# Patient Record
Sex: Male | Born: 1960 | Race: White | Hispanic: No | Marital: Single | Smoking: Current every day smoker
Health system: Southern US, Community
[De-identification: ages and names within clinical notes are randomized; demographics above are authoritative.]

## PROBLEM LIST (undated history)

## (undated) DIAGNOSIS — I10 Essential (primary) hypertension: Secondary | ICD-10-CM

## (undated) HISTORY — PX: EYE SURGERY: SHX253

---

## 2001-08-10 ENCOUNTER — Ambulatory Visit (HOSPITAL_COMMUNITY): Admission: RE | Admit: 2001-08-10 | Discharge: 2001-08-10 | Payer: Self-pay | Admitting: Family Medicine

## 2001-08-10 ENCOUNTER — Encounter: Payer: Self-pay | Admitting: Family Medicine

## 2001-11-10 ENCOUNTER — Encounter: Payer: Self-pay | Admitting: Family Medicine

## 2001-11-10 ENCOUNTER — Ambulatory Visit (HOSPITAL_COMMUNITY): Admission: RE | Admit: 2001-11-10 | Discharge: 2001-11-10 | Payer: Self-pay | Admitting: Family Medicine

## 2002-11-23 ENCOUNTER — Ambulatory Visit (HOSPITAL_COMMUNITY): Admission: RE | Admit: 2002-11-23 | Discharge: 2002-11-23 | Payer: Self-pay | Admitting: Internal Medicine

## 2008-07-29 ENCOUNTER — Emergency Department (HOSPITAL_COMMUNITY): Admission: EM | Admit: 2008-07-29 | Discharge: 2008-07-29 | Payer: Self-pay | Admitting: Emergency Medicine

## 2008-08-31 ENCOUNTER — Ambulatory Visit (HOSPITAL_COMMUNITY): Admission: RE | Admit: 2008-08-31 | Discharge: 2008-08-31 | Payer: Self-pay | Admitting: Cardiology

## 2010-08-01 LAB — CBC
HCT: 47.2 % (ref 39.0–52.0)
Hemoglobin: 16.4 g/dL (ref 13.0–17.0)
MCHC: 34.6 g/dL (ref 30.0–36.0)
MCV: 88.4 fL (ref 78.0–100.0)
Platelets: 268 10*3/uL (ref 150–400)
RBC: 5.34 MIL/uL (ref 4.22–5.81)
RDW: 13.9 % (ref 11.5–15.5)
WBC: 8.3 10*3/uL (ref 4.0–10.5)

## 2010-08-01 LAB — DIFFERENTIAL
Basophils Absolute: 0.1 10*3/uL (ref 0.0–0.1)
Basophils Relative: 1 % (ref 0–1)
Eosinophils Absolute: 0.1 10*3/uL (ref 0.0–0.7)
Eosinophils Relative: 1 % (ref 0–5)
Lymphocytes Relative: 28 % (ref 12–46)
Lymphs Abs: 2.3 10*3/uL (ref 0.7–4.0)
Monocytes Absolute: 0.6 10*3/uL (ref 0.1–1.0)
Monocytes Relative: 7 % (ref 3–12)
Neutro Abs: 5.2 10*3/uL (ref 1.7–7.7)
Neutrophils Relative %: 62 % (ref 43–77)

## 2010-08-01 LAB — BASIC METABOLIC PANEL
BUN: 13 mg/dL (ref 6–23)
CO2: 26 mEq/L (ref 19–32)
Calcium: 8.9 mg/dL (ref 8.4–10.5)
Chloride: 103 mEq/L (ref 96–112)
Creatinine, Ser: 0.84 mg/dL (ref 0.4–1.5)
GFR calc Af Amer: >60 mL/min (ref >60)
GFR calc non Af Amer: >60 mL/min (ref >60)
Glucose, Bld: 118 mg/dL — ABNORMAL HIGH (ref 70–99)
Potassium: 4 mEq/L (ref 3.5–5.1)
Sodium: 138 mEq/L (ref 135–145)

## 2010-08-01 LAB — POCT CARDIAC MARKERS
CKMB, poc: <1 ng/mL — ABNORMAL LOW (ref 1.0–8.0)
Troponin i, poc: <0.05 ng/mL (ref 0.00–0.09)

## 2010-09-04 NOTE — Cardiovascular Report (Signed)
NAME:  KWASI, JOUNG NO.:  [id number]   MEDICAL RECORD NO.:  [id number]          PATIENT TYPE:  OIB   LOCATION:  2899                         FACILITY:  [facility]   PHYSICIAN:  Nanetta Batty, M.D.   DATE OF BIRTH:  Dec 20, 1960   DATE OF PROCEDURE:  08/31/2008  DATE OF DISCHARGE:  08/31/2008                            CARDIAC CATHETERIZATION   PROCEDURE:  Diagnostic coronary arteriogram.   INDICATIONS:  [name] is a 50 year old gentleman with a history of  hypertension, dyslipidemia, and tobacco abuse who was seen by Dr.  Garen Lah for evaluation of chest pain.  He did have a low risk Myoview,  however, because of his symptoms Dr. Garen Lah felt compelled to proceed  with outpatient diagnostic coronary arteriography to define his anatomy  and rule out an ischemic etiology.   DESCRIPTION OF PROCEDURE:  The patient was brought to the Second Floor  [facility] in the postabsorptive state.  She was  premedicated with p.o. Valium.  Her right groin was prepped and shave in  the usual sterile fashion.  Xylocaine 1% was used for local anesthesia.  A 6-French sheath was inserted into the right femoral artery using  standard Seldinger technique.  A 6-French right-left Judkins diagnostic  catheter as well as a 6-French pigtail catheter were used for selective  coronary angiography, left ventriculography, and distal abdominal  aortography.  Visipaque dye was used for the entirety of the case.  Retrograde aorta, left ventricular, and pullback pressures were  recorded.   HEMODYNAMICS:  1. Aortic systolic pressure 131, diastolic pressure 75.  2. Left ventricular systolic pressure 136, end-diastolic pressure 15.   SELECTIVE CORONARY ANGIOGRAPHY:  1. Left main normal.  2. LAD normal.  3. Left circumflex normal.  4. Right coronary is dominant and normal.  5. Left ventriculography; RAO left ventriculogram was performed using      25 mL of Visipaque dye at 12 mL  per second.  The overall LVEF was      estimated at greater than 60% without focal wall motion      abnormalities.  6. Distal abdominal aortography; distal abdominal aortogram was      performed using 20 mL of Visipaque dye at 20 mL per second.  The      renal arteries revealed at most 30-40% proximal left renal artery      stenosis.  Infrarenal abdominal aorta and iliac bifurcation appear      free of significant atherosclerotic changes.   IMPRESSION:  Mr. Ake has normal coronary arteries, normal left  ventricular function, and minimal renal vascular disease.  We believe  his chest pain is noncardiac.  Empiric antireflux medical therapy will  be recommended as well as cardiac risk factor modification.   Sheath was removed and pressure was held on the groin to achieve  hemostasis.  The patient left the lab in stable condition.  He will  remain recumbent for 5 hours after which time he will be discharged home  and will see Dr. Garen Lah back in followup.      Nanetta Batty, M.D.  Electronically  Signed     JB/MEDQ  D:  08/31/2008  T:  09/01/2008  Job:  [id number]   cc:   [facility]  [facility]  Sheliah Mends, MD  Madelin Rear. Sherwood Gambler, MD

## 2010-09-07 NOTE — Op Note (Signed)
NAME:  Thomas Johns, Thomas Johns                         ACCOUNT NO.:  [id number]   MEDICAL RECORD NO.:  [id number]                   PATIENT TYPE:  AMB   LOCATION:  DAY                                  FACILITY:  [facility]   PHYSICIAN:  R. Roetta Sessions, M.D.              DATE OF BIRTH:  1961/03/20   DATE OF PROCEDURE:  11/23/2002  DATE OF DISCHARGE:                                 OPERATIVE REPORT   PROCEDURE:  Diagnostic esophagogastroduodenoscopy and colonoscopy with snare  polypectomy and biopsy.   ENDOSCOPIST:  Gerrit Friends. Rourk, M.D.   INDICATIONS FOR PROCEDURE:  The patient is a 50 year old gentleman with  longstanding gastroesophageal reflux disease with intermittent blood per  rectum recently.  He is Hemoccult-positive.  His reflux symptoms have been  somewhat refractory to Nexium.  He has had vague intermittent esophageal  dysphagia.  EGD and colonoscopy are now being done.  CBC in my office,  recently, was completely normal.   Please see my dictated note of November 11, 2002 for more information.   PROCEDURE NOTE:  O2 saturation, blood pressure, pulse and respirations were  monitored throughout the entire procedure.  Conscious sedation: Versed 4 mg  IV, Demerol 100 mg IV in divided doses.   INSTRUMENT:  Olympus video chip adult gastroscope and colonoscope.   FINDINGS:  Examination of the tubular esophagus revealed a couple of small  distal esophageal erosions with no Barrett's esophagus.  The EG junction was  patulous.  He retched a couple of times during the procedure and there was  no evidence of hernia with those maneuvers.  The EG junction was easily  traversed.   STOMACH:  The gastric cavity was empty.  It insufflated well with air.  A  thorough examination of the gastric mucosa including a retroflex view of the  proximal stomach and esophagogastric junction demonstrated only a small  hiatal hernia.  The pylorus was patent and easily traversed.   DUODENUM:  The bulb and the  second portion appeared normal.   THERAPEUTIC/DIAGNOSTIC MANEUVERS:  None.   The patient tolerated the procedure well and was prepared for colonoscopy.   A digital rectal exam revealed no abnormalities.   ENDOSCOPIC FINDINGS:  The prep was adequate.   RECTUM:  Examination of the rectal mucosa including the retroflex view of  the anal verge revealed only minimal internal hemorrhoids.   COLON:  The colonic mucosa was surveyed from the rectosigmoid junction  through the left transverse and right colon to the area of the appendiceal  orifice, ileocecal valve, and cecum.  These structures were well seen and  photographed for the record.   From this level of the scope was slowly withdrawn.  All previously mentioned  mucosal surfaces were again seen.  There were multiple 6-to-7-mm,  pedunculated polyps; 2 just distal to the ileocecal valve; 2 at the hepatic  flexure which were resected with a snare, cautery and  recovered through the  scope.  There may have been 1 other polyp seen transiently behind a fold at  the area of the hepatic flexure; however, this could not be confirmed  because of acute angulation and difficulty with visualization.  Otherwise  there are no colonic mucosal abnormalities.  The patient tolerated both  procedures well and was reacted in endoscopy.   EGD IMPRESSION:  1. Distal esophageal erosions consistent with mild erosive reflux     esophagitis.  Otherwise the esophageal mucosa appeared normal.  2. Normal stomach.  3. Normal D1 and D2.   COLONOSCOPY FINDINGS:  1. Normal rectum.  2. Right colon polyps resected as described above.  3. He did have minimal internal hemorrhoids.   RECOMMENDATIONS:  1. Stop Nexium.  Begin Prevacid 30 mg orally daily.  2. No aspirin or arthritis medications for 10 days.  3. Follow up on path.  4. Further recommendations to follow.   I have discussed my findings and recommendations with the patient and the  patient's mother via  telephone after they arrived home from the hospital.                                               R. Roetta Sessions, M.D.    RMR/MEDQ  D:  11/23/2002  T:  11/23/2002  Job:  [id number]   cc:   Corrie Mckusick, M.D.  [address] [place]ure[place]s[address] [place]  [place]  [place] [address]  Fax: [phone]

## 2010-09-07 NOTE — Consult Note (Signed)
NAME:  Thomas, Johns                     ACCOUNT NO.:  [id number]   MEDICAL RECORD NO.:  [id number]                  PATIENT TYPE:   LOCATION:                                       FACILITY:   PHYSICIAN:  R. Roetta Sessions, M.D.              DATE OF BIRTH:  1961-03-23   DATE OF CONSULTATION:  11/11/2002  DATE OF DISCHARGE:                                   CONSULTATION   PRIMARY CARE PHYSICIAN:  Corrie Mckusick, M.D.   REASON FOR CONSULTATION:  Hemoccult positive stool.   HISTORY OF PRESENT ILLNESS:  Thomas Johns is a pleasant 50 year old  gentleman sent over at the courtesy of Dr. Dorthey Sawyer to further evaluate  hemoccult positive stool.  He returned cards to Dr. Lamar Blinks office and  informed they were positive.  Mr. Cheek thinks that he has been passing a  small amount of blood per rectum on occasion but he is not sure.  He moves  his bowels once daily.  This is the symptom that led him to go see Dr.  Phillips Odor.  Thomas Johns has a 15+ year history of almost daily heartburn  symptoms.  He takes Nexium 40 mg orally daily and continues to have  breakthrough symptoms.  He describes intermittent vague esophageal  dysphagia.  He has never had his upper GI tract evaluated.  He also takes an  OTC pain pill for various aches and pains; he does not know if it is an  NSAID or non-aspirin product.  He smokes two packs of cigarettes per day.  He does not use alcohol.   PAST MEDICAL HISTORY:  Significant for longstanding gastroesophageal reflux  disease.  History of amblyopia and left eye surgery, diminished vision of  left eye; the surgery was performed when he was in the third grade.   FAMILY HISTORY:  Mother has lupus.  Father is alive in good health.  No  history of chronic GI or liver history.   SOCIAL HISTORY:  The patient is single, no children, works second shift at  [employer], smokes two packs of cigarettes per day.   REVIEW OF SYSTEMS:  No chest pain, no dyspnea,  no fever or chills, no change  in weight.   PHYSICAL EXAMINATION:  GENERAL:  Reveals a 50 year old gentleman, slightly  disheveled, alert and conversant.  He has some strabismus.  VITAL SIGNS:  Weight 193, height 5 feet 7.  Temperature 97.3, blood pressure  130/84, pulse 88.  SKIN:  Warm and dry.  There is no jaundice.  HEENT:  No scleral icterus.  Conjunctivae are pink.  Oral cavity:  Dentition  in fair state of repair, no lesions.  JVD is not prominent.  CHEST:  Lungs are clear to auscultation.  CARDIAC:  Regular rate and rhythm without murmur, gallop, or rub.  ABDOMEN:  Nondistended, positive bowel sounds, soft, nontender, without  appreciable mass or organomegaly.  EXTREMITIES:  Have no edema.  RECTAL:  Deferred to the time of colonoscopy.   IMPRESSION:  Mr. Thomas Johns is a pleasant 50 year old gentleman  complaining of recent blood per rectum; he is hemoccult positive.  He has  longstanding reflux symptoms somewhat refractory to Nexium.  He has also  described vague esophageal dysphagia.   RECOMMENDATIONS:  This gentleman needs to have both his upper and lower GI  tract evaluated in the very near future.  I have discussed the approach of  EGD and colonoscopy with Mr. Clinkscale.  Potential risks, benefits, and  alternatives have been reviewed; he is agreeable.  Will plan to set these up  at [facility].   Will also check a CBC today.  Further recommendations to follow.   I would like to thank Dr. Dorthey Sawyer for allowing me to see this nice  gentleman today.                                               Jonathon Bellows, M.D.    RMR/MEDQ  D:  11/11/2002  T:  11/11/2002  Job:  [id number]

## 2010-11-22 ENCOUNTER — Emergency Department (HOSPITAL_COMMUNITY)
Admission: EM | Admit: 2010-11-22 | Discharge: 2010-11-22 | Disposition: A | Discharge status: Home / Self Care | Payer: Worker's Compensation | Attending: Emergency Medicine | Admitting: Emergency Medicine

## 2010-11-22 ENCOUNTER — Encounter: Payer: Self-pay | Admitting: *Deleted

## 2010-11-22 DIAGNOSIS — F17200 Nicotine dependence, unspecified, uncomplicated: Secondary | ICD-10-CM | POA: Insufficient documentation

## 2010-11-22 DIAGNOSIS — W268XXA Contact with other sharp object(s), not elsewhere classified, initial encounter: Secondary | ICD-10-CM | POA: Insufficient documentation

## 2010-11-22 DIAGNOSIS — S61219A Laceration without foreign body of unspecified finger without damage to nail, initial encounter: Secondary | ICD-10-CM

## 2010-11-22 DIAGNOSIS — S61209A Unspecified open wound of unspecified finger without damage to nail, initial encounter: Secondary | ICD-10-CM | POA: Insufficient documentation

## 2010-11-22 DIAGNOSIS — Y9269 Other specified industrial and construction area as the place of occurrence of the external cause: Secondary | ICD-10-CM | POA: Insufficient documentation

## 2010-11-22 DIAGNOSIS — I10 Essential (primary) hypertension: Secondary | ICD-10-CM | POA: Insufficient documentation

## 2010-11-22 HISTORY — DX: Essential (primary) hypertension: I10

## 2010-11-22 MED ORDER — DOUBLE ANTIBIOTIC 500-10000 UNIT/GM EX OINT: TOPICAL_OINTMENT | Freq: Once | CUTANEOUS | Status: DC

## 2010-11-22 MED ORDER — LIDOCAINE HCL (PF) 1 % IJ SOLN
5.0000 mL | Freq: Once | INTRAMUSCULAR | Status: AC
  Administered 2010-11-22: 5 mL
  Filled 2010-11-22: qty 5

## 2010-11-22 MED ORDER — BACITRACIN ZINC 500 UNIT/GM EX OINT
TOPICAL_OINTMENT | CUTANEOUS | Status: AC
  Filled 2010-11-22: qty 0.9

## 2010-11-22 MED ORDER — TETANUS-DIPHTH-ACELL PERTUSSIS 5-2.5-18.5 LF-MCG/0.5 IM SUSP
0.5000 mL | Freq: Once | INTRAMUSCULAR | Status: AC
  Administered 2010-11-22: 0.5 mL via INTRAMUSCULAR
  Filled 2010-11-22: qty 0.5

## 2010-11-22 NOTE — ED Provider Notes (Signed)
History     CSN: [id number] Arrival date & time: 11/22/2010  8:29 PM  Chief Complaint  Patient presents with  . Laceration   HPI Comments: Patient c/o flap type lac to the left third finger.  States that he was trying to remove something from a metal belt that was moving at the time and accidentally caught his finger in it.  Was wearing a cotton glove covered with a latex glove at the time.  Denies numbness, weakness to the finger.  Injury occurred at work.  Patient is right hand dominant  Patient is a 50 y.o. male presenting with skin laceration. The history is provided by the patient.  Laceration  The incident occurred 1 to 2 hours ago. The laceration is located on the left hand. The laceration is 2 cm in size. The laceration mechanism was a a metal edge. The pain is mild. The pain has been constant since onset. He reports no foreign bodies present. His tetanus status is unknown.    Past Medical History  Diagnosis Date  . Hypertension     History reviewed. No pertinent past surgical history.  History reviewed. No pertinent family history.  History  Substance Use Topics  . Smoking status: Current Everyday Smoker  . Smokeless tobacco: Not on file  . Alcohol Use: No      Review of Systems  Constitutional: Negative for fever and chills.  HENT: Negative for neck pain and neck stiffness.   Respiratory: Negative.   Cardiovascular: Negative.   Musculoskeletal: Negative.  Negative for myalgias, back pain, joint swelling and arthralgias.  Skin: Positive for wound.  Neurological: Negative for dizziness, weakness and numbness.  Hematological: Negative for adenopathy. Does not bruise/bleed easily.    Physical Exam  BP 148/80  Pulse 105  Temp 98.8 F (37.1 C)  Resp 20  Ht 5\' 6"  (1.676 m)  Wt 175 lb (79.379 kg)  BMI 28.25 kg/m2  SpO2 99%  Physical Exam  Nursing note and vitals reviewed. Constitutional: He is oriented to person, place, and time. He appears well-developed and  well-nourished. No distress.  HENT:  Head: Normocephalic and atraumatic.  Neck: Normal range of motion.  Cardiovascular: Normal rate, regular rhythm and normal heart sounds.   Pulmonary/Chest: Effort normal and breath sounds normal.  Musculoskeletal: He exhibits no edema and no tenderness.  Lymphadenopathy:    He has no cervical adenopathy.  Neurological: He is alert and oriented to person, place, and time. He exhibits normal muscle tone. Coordination normal.  Skin: Skin is warm and dry.       2 cm superficial flap type lac to the dorsal left third finger just distal to the DIP joint.  Bleeding controlled.  No joint involvement, muscle or tendon injury seen.  Nail was not involved.  Pt has full ROM of the finger.    ED Course  LACERATION REPAIR Date/Time: 11/22/2010 9:30 PM Performed by: Trisha Mangle, Susen Haskew L. Authorized by: Joya Gaskins Consent: Verbal consent obtained. Written consent not obtained. Risks and benefits: risks, benefits and alternatives were discussed Consent given by: patient Patient understanding: patient states understanding of the procedure being performed Patient consent: the patient's understanding of the procedure matches consent given Procedure consent: procedure consent matches procedure scheduled Relevant documents: relevant documents present and verified Site marked: the operative site was marked Patient identity confirmed: verbally with patient Time out: Immediately prior to procedure a "time out" was called to verify the correct patient, procedure, equipment, support staff and site/side marked as  required. Body area: upper extremity Location details: left long finger Laceration length: 2 cm Foreign bodies: no foreign bodies Tendon involvement: none Nerve involvement: none Vascular damage: no Anesthesia: local infiltration Local anesthetic: lidocaine 1% without epinephrine Anesthetic total: 1 ml Patient sedated: no Preparation: Patient was prepped  and draped in the usual sterile fashion. Irrigation solution: saline Irrigation method: syringe Amount of cleaning: standard Debridement: none Degree of undermining: none Skin closure: 4-0 Prolene Number of sutures: 3 Technique: simple Approximation: close Approximation difficulty: simple Dressing: antibiotic ointment and 4x4 sterile gauze Patient tolerance: Patient tolerated the procedure well with no immediate complications.    MDM    2140  Superficial flap type lac to the dorsal surface of the distal left third finger.  No muscle or tendon injury seen.  Pt has full ROM of the finger.  Distal sensation intact,CR>2 sec.  Dressing was applied by the nursing staff.       Moriah Shawley L. [place], [place] 11/22/10 2202

## 2010-11-22 NOTE — ED Notes (Signed)
Pts left finger was cut by a wire belt at work. Bleeding controlled.

## 2010-11-24 NOTE — ED Provider Notes (Signed)
Medical screening examination/treatment/procedure(s) were performed by non-physician practitioner and as supervising physician I was immediately available for consultation/collaboration.  Meekah Math W Paisely Brick, MD 11/24/10 1811 

## 2015-01-02 ENCOUNTER — Emergency Department (HOSPITAL_COMMUNITY): Payer: BLUE CROSS/BLUE SHIELD

## 2015-01-02 ENCOUNTER — Emergency Department (HOSPITAL_COMMUNITY)
Admission: EM | Admit: 2015-01-02 | Discharge: 2015-01-02 | Disposition: A | Discharge status: Home / Self Care | Payer: BLUE CROSS/BLUE SHIELD | Attending: Emergency Medicine | Admitting: Emergency Medicine

## 2015-01-02 ENCOUNTER — Encounter (HOSPITAL_COMMUNITY): Payer: Self-pay | Admitting: Emergency Medicine

## 2015-01-02 DIAGNOSIS — Z720 Tobacco use: Secondary | ICD-10-CM | POA: Insufficient documentation

## 2015-01-02 DIAGNOSIS — I10 Essential (primary) hypertension: Secondary | ICD-10-CM | POA: Insufficient documentation

## 2015-01-02 DIAGNOSIS — R140 Abdominal distension (gaseous): Secondary | ICD-10-CM | POA: Insufficient documentation

## 2015-01-02 DIAGNOSIS — R600 Localized edema: Secondary | ICD-10-CM | POA: Diagnosis not present

## 2015-01-02 DIAGNOSIS — Z79899 Other long term (current) drug therapy: Secondary | ICD-10-CM | POA: Insufficient documentation

## 2015-01-02 DIAGNOSIS — R0602 Shortness of breath: Secondary | ICD-10-CM | POA: Diagnosis not present

## 2015-01-02 DIAGNOSIS — R05 Cough: Secondary | ICD-10-CM | POA: Diagnosis not present

## 2015-01-02 DIAGNOSIS — R609 Edema, unspecified: Secondary | ICD-10-CM

## 2015-01-02 DIAGNOSIS — M7989 Other specified soft tissue disorders: Secondary | ICD-10-CM | POA: Diagnosis present

## 2015-01-02 LAB — CBC WITH DIFFERENTIAL/PLATELET
Basophils Absolute: 0.1 10*3/uL (ref 0.0–0.1)
Basophils Relative: 1 % (ref 0–1)
EOS ABS: 0.2 10*3/uL (ref 0.0–0.7)
EOS PCT: 3 % (ref 0–5)
HCT: 47.3 % (ref 39.0–52.0)
HEMOGLOBIN: 15.2 g/dL (ref 13.0–17.0)
LYMPHS ABS: 2.3 10*3/uL (ref 0.7–4.0)
LYMPHS PCT: 31 % (ref 12–46)
MCH: 29.2 pg (ref 26.0–34.0)
MCHC: 32.1 g/dL (ref 30.0–36.0)
MCV: 91 fL (ref 78.0–100.0)
MONOS PCT: 12 % (ref 3–12)
Monocytes Absolute: 0.9 10*3/uL (ref 0.1–1.0)
NEUTROS PCT: 53 % (ref 43–77)
Neutro Abs: 3.9 10*3/uL (ref 1.7–7.7)
Platelets: 283 10*3/uL (ref 150–400)
RBC: 5.2 MIL/uL (ref 4.22–5.81)
RDW: 14.9 % (ref 11.5–15.5)
WBC: 7.3 10*3/uL (ref 4.0–10.5)

## 2015-01-02 LAB — COMPREHENSIVE METABOLIC PANEL
ALBUMIN: 4.3 g/dL (ref 3.5–5.0)
ALK PHOS: 86 U/L (ref 38–126)
ALT: 45 U/L (ref 17–63)
AST: 27 U/L (ref 15–41)
Anion gap: 7 (ref 5–15)
BUN: 14 mg/dL (ref 6–20)
CALCIUM: 9 mg/dL (ref 8.9–10.3)
CHLORIDE: 107 mmol/L (ref 101–111)
CO2: 26 mmol/L (ref 22–32)
CREATININE: 0.73 mg/dL (ref 0.61–1.24)
GFR calc Af Amer: >60 mL/min (ref >60)
GFR calc non Af Amer: >60 mL/min (ref >60)
GLUCOSE: 112 mg/dL — AB (ref 65–99)
Potassium: 4 mmol/L (ref 3.5–5.1)
SODIUM: 140 mmol/L (ref 135–145)
Total Bilirubin: 0.4 mg/dL (ref 0.3–1.2)
Total Protein: 7.1 g/dL (ref 6.5–8.1)

## 2015-01-02 LAB — URINALYSIS, ROUTINE W REFLEX MICROSCOPIC
Bilirubin Urine: NEGATIVE
GLUCOSE, UA: NEGATIVE mg/dL
Hgb urine dipstick: NEGATIVE
Ketones, ur: NEGATIVE mg/dL
LEUKOCYTES UA: NEGATIVE
NITRITE: NEGATIVE
PH: 5.5 (ref 5.0–8.0)
Protein, ur: NEGATIVE mg/dL
SPECIFIC GRAVITY, URINE: 1.01 (ref 1.005–1.030)
Urobilinogen, UA: 0.2 mg/dL (ref 0.0–1.0)

## 2015-01-02 LAB — BRAIN NATRIURETIC PEPTIDE: B Natriuretic Peptide: 9 pg/mL (ref 0.0–100.0)

## 2015-01-02 LAB — TROPONIN I

## 2015-01-02 IMAGING — DX DG CHEST 2V
2 series · 2 of 2 positions shown · non-contrast
Comparison: [DATE]

CLINICAL DATA: Shortness of breath. Swelling of the legs and feet.
Nonproductive cough for 3 days. Smoker. Hypertension.

EXAM:
CHEST  2 VIEW

[chest pa]
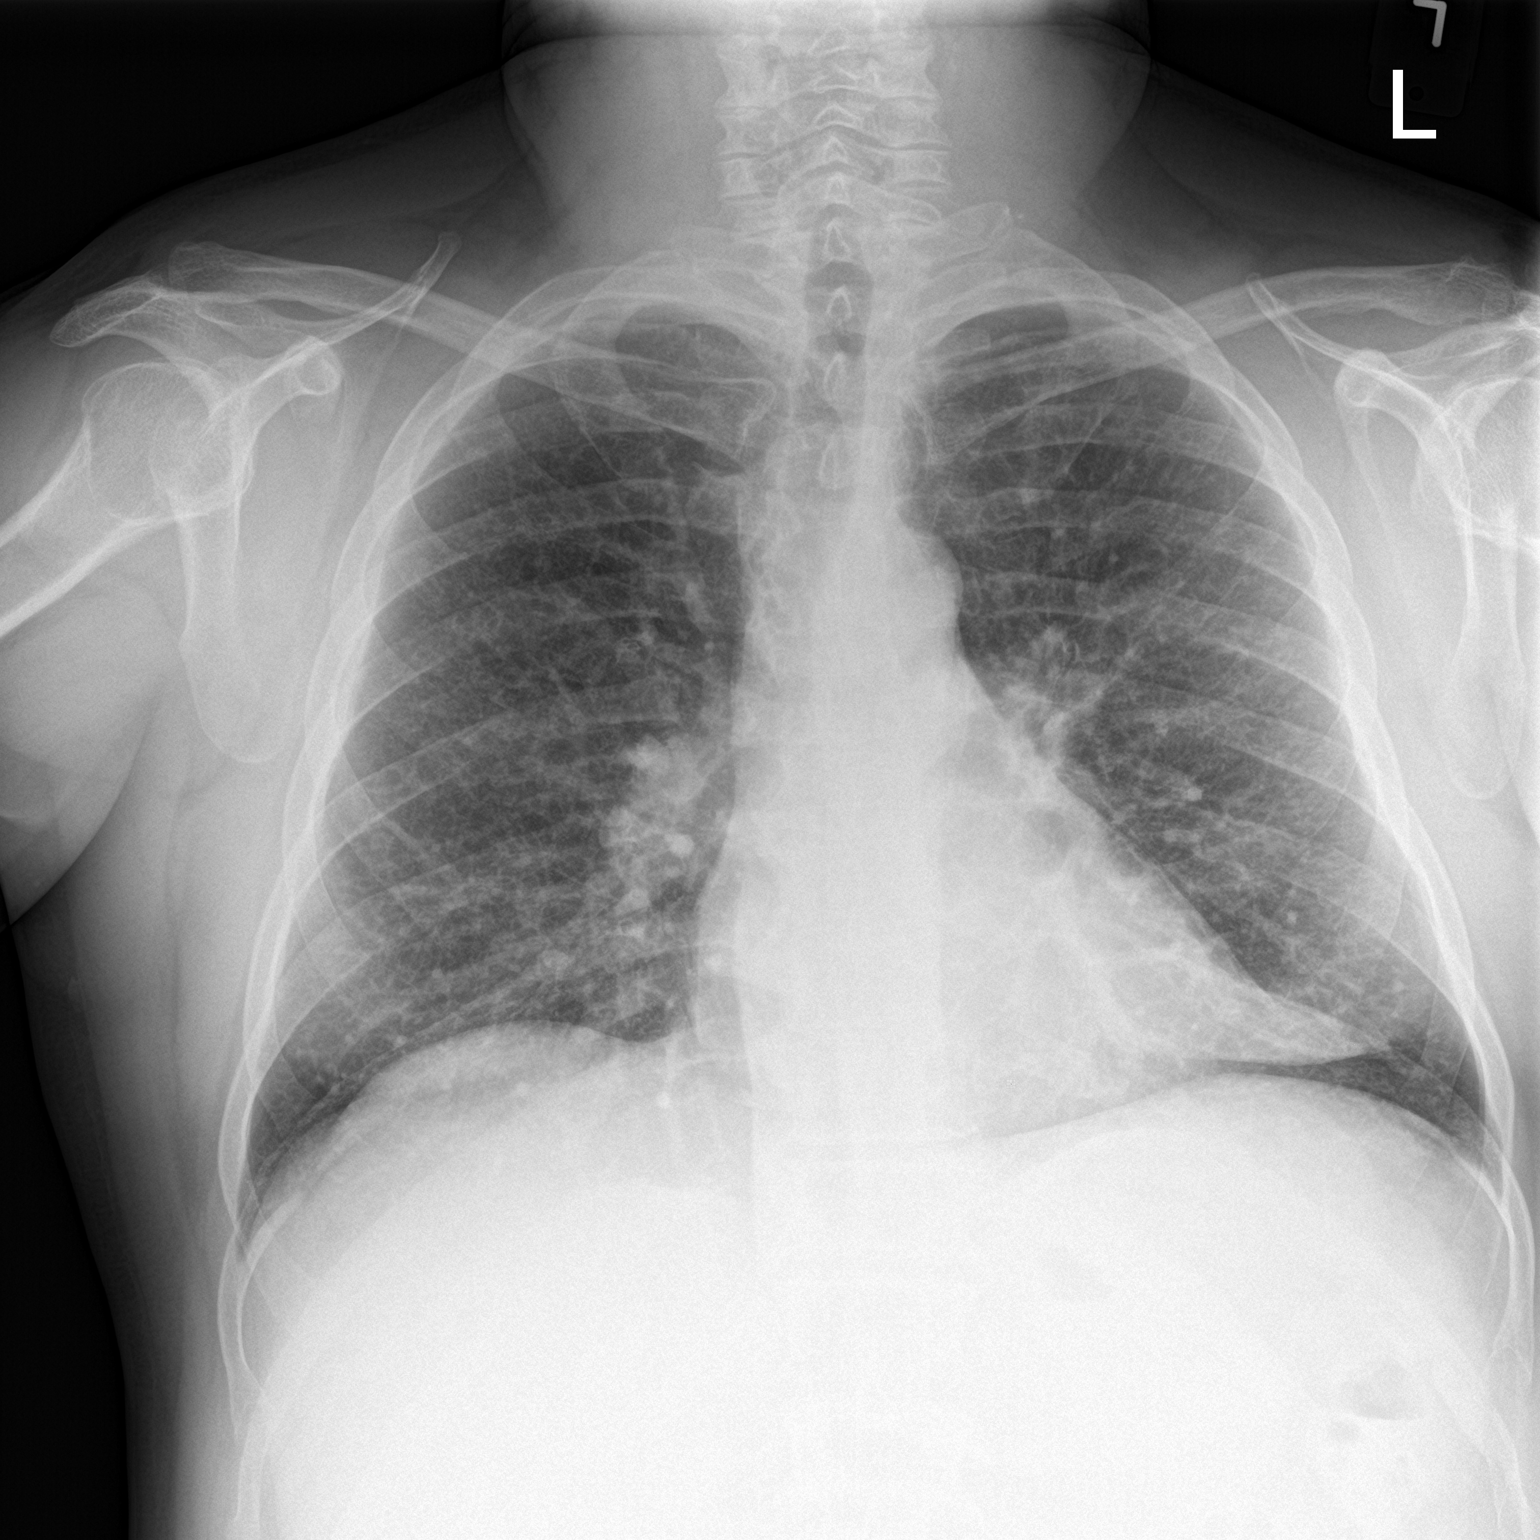

[chest lat]
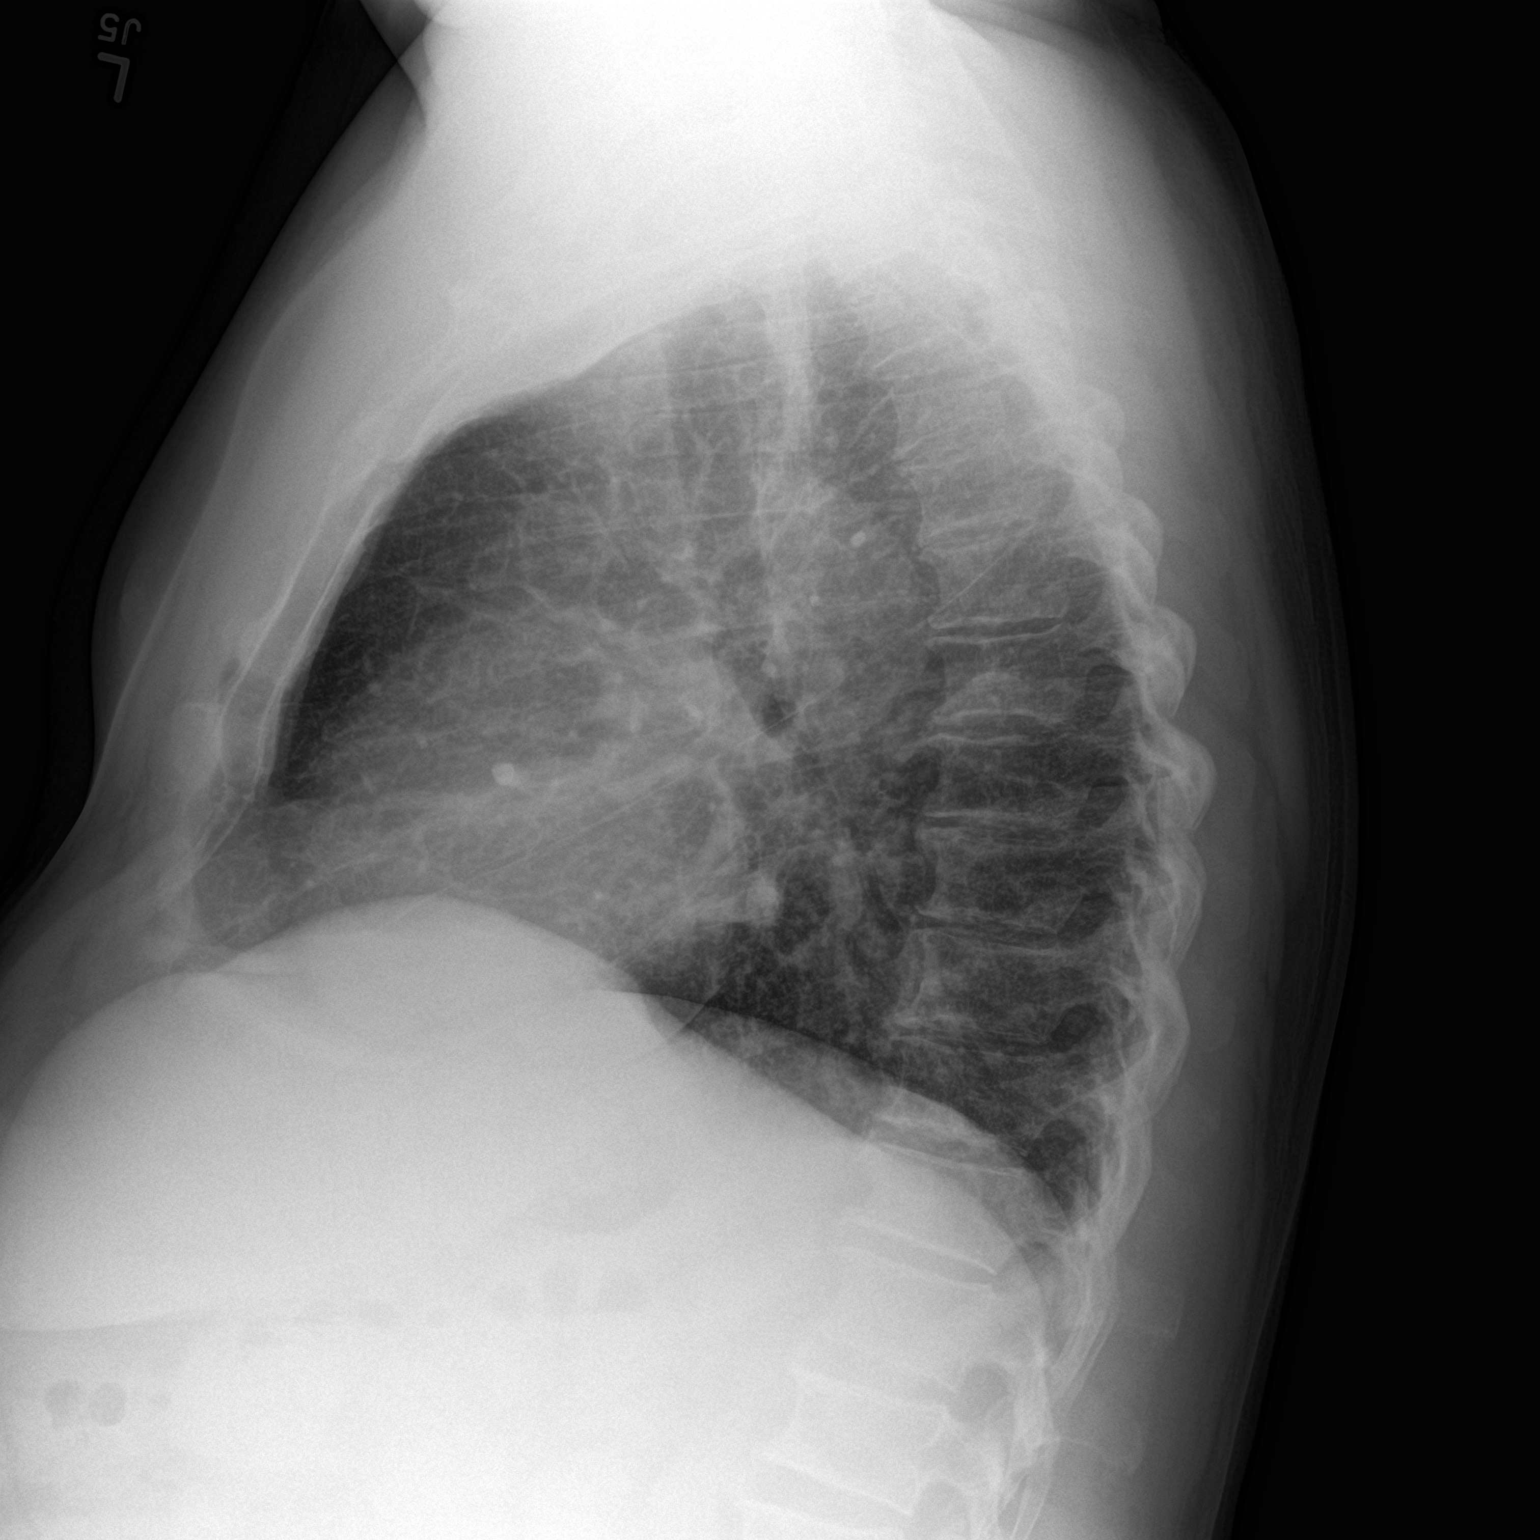

[2 of 2 positions shown; findings below may reference images not displayed]

FINDINGS: Mild interstitial accentuation, as can commonly be encountered in
smokers. The lungs appear otherwise clear.

Cardiac and mediastinal margins appear normal.

No pleural effusion.  Thoracic spondylosis noted.
IMPRESSION: 1. Mild interstitial accentuation, as can commonly be encountered in
smokers.
2. No acute findings.
3. Thoracic spondylosis.

## 2015-01-02 MED ORDER — FUROSEMIDE 10 MG/ML IJ SOLN
60.0000 mg | Freq: Once | INTRAMUSCULAR | Status: AC
  Administered 2015-01-02: 60 mg via INTRAVENOUS
  Filled 2015-01-02: qty 6

## 2015-01-02 MED ORDER — ACETAMINOPHEN 325 MG PO TABS
650.0000 mg | ORAL_TABLET | Freq: Once | ORAL | Status: AC
  Administered 2015-01-02: 650 mg via ORAL
  Filled 2015-01-02: qty 2

## 2015-01-02 MED ORDER — FUROSEMIDE 20 MG PO TABS: 60.0000 mg | ORAL_TABLET | Freq: Every day | ORAL | Status: DC

## 2015-01-02 MED ORDER — NITROGLYCERIN 2 % TD OINT
1.0000 [in_us] | TOPICAL_OINTMENT | Freq: Once | TRANSDERMAL | Status: AC
  Administered 2015-01-02: 1 [in_us] via TOPICAL
  Filled 2015-01-02: qty 1

## 2015-01-02 MED ORDER — ENOXAPARIN SODIUM 100 MG/ML ~~LOC~~ SOLN
1.0000 mg/kg | Freq: Once | SUBCUTANEOUS | Status: AC
  Administered 2015-01-02: 85 mg via SUBCUTANEOUS
  Filled 2015-01-02: qty 1

## 2015-01-02 NOTE — ED Provider Notes (Signed)
CSN: [id number]     Arrival date & time 01/02/15  1423 History   First MD Initiated Contact with Patient 01/02/15 1805    Chief Complaint  Patient presents with  . Leg Swelling     (Consider location/radiation/quality/duration/timing/severity/associated sxs/prior Treatment) HPI  Patient reports about 3 weeks ago he started getting swelling in his legs. He states both legs are swollen but the right is worse than the left. He states the bottom of his feet are sore otherwise he has no pain in. He states his swelling is getting progressively worse and now has swelling in his abdomen and his clothes feel tight. He was seen by his PCP on September 8 and started on Lasix 20 mg once a day. He states it is not making him urinate more frequently and he has noted no improvement in the swelling. He has shortness of breath at times, states he has wheezing at times. However patient states he smokes 2 packs a day. He has a cough that's nonproductive. He denies chest pain. He states he had a episode of swelling several years ago that resolved quickly without treatment.  PCP Dr Gerarda Fraction  Past Medical History  Diagnosis Date  . Hypertension    Past Surgical History  Procedure Laterality Date  . Eye surgery     Family History  Problem Relation Age of Onset  . Lupus Mother   . Rheum arthritis Mother   . Diabetes Mother    Social History  Substance Use Topics  . Smoking status: Current Every Day Smoker -- 2.00 packs/day    Types: Cigarettes  . Smokeless tobacco: Never Used  . Alcohol Use: No  employed  Review of Systems  All other systems reviewed and are negative.     Allergies  Review of patient's allergies indicates no known allergies.  Home Medications   Prior to Admission medications   Medication Sig Start Date End Date Taking? Authorizing Provider  Multiple Vitamin (ONE-A-DAY MENS PO) Take 1 tablet by mouth daily.     Yes Historical Provider, MD  furosemide (LASIX) 20 MG tablet Take  3 tablets (60 mg total) by mouth daily. 01/02/15   Rolland Porter, MD   BP 132/93 mmHg  Pulse 90  Temp(Src) 98 F (36.7 C) (Oral)  Resp 14  Ht 5\' 7"  (1.702 m)  Wt 200 lb (90.719 kg)  BMI 31.32 kg/m2  SpO2 98%  Vital signs normal   Physical Exam  Constitutional: He is oriented to person, place, and time. He appears well-developed and well-nourished.  Non-toxic appearance. He does not appear ill. No distress.  HENT:  Head: Normocephalic and atraumatic.  Right Ear: External ear normal.  Left Ear: External ear normal.  Nose: Nose normal. No mucosal edema or rhinorrhea.  Mouth/Throat: Oropharynx is clear and moist and mucous membranes are normal. No dental abscesses or uvula swelling.  Eyes: Conjunctivae and EOM are normal. Pupils are equal, round, and reactive to light.  Neck: Normal range of motion and full passive range of motion without pain. Neck supple.  Cardiovascular: Normal rate, regular rhythm and normal heart sounds.  Exam reveals no gallop and no friction rub.   No murmur heard. Pulmonary/Chest: Effort normal and breath sounds normal. No respiratory distress. He has no wheezes. He has no rhonchi. He has no rales. He exhibits no tenderness and no crepitus.  Abdominal: Soft. Normal appearance and bowel sounds are normal. He exhibits distension. There is no tenderness. There is no rebound and no guarding.  Musculoskeletal: Normal range of motion. He exhibits edema. He exhibits no tenderness.  Moves all extremities well. Patient has mild diffuse swelling of both lower extremities, right is worse than the left.  Neurological: He is alert and oriented to person, place, and time. He has normal strength. No cranial nerve deficit.  Skin: Skin is warm, dry and intact. No rash noted. No erythema. No pallor.  Psychiatric: He has a normal mood and affect. His speech is normal and behavior is normal. His mood appears not anxious.  Nursing note and vitals reviewed.   ED Course  Procedures  (including critical care time)  Medications  furosemide (LASIX) injection 60 mg (60 mg Intravenous Given 01/02/15 1931)  nitroGLYCERIN (NITROGLYN) 2 % ointment 1 inch (1 inch Topical Given 01/02/15 1932)  acetaminophen (TYLENOL) tablet 650 mg (650 mg Oral Given 01/02/15 1931)  enoxaparin (LOVENOX) injection 85 mg (85 mg Subcutaneous Given 01/02/15 2246)   Pt was given lasix and he had good urinary output. After his tests resulted it was not clear what the etiology of his edema was, his cardiac, hepatic, and renal tests were all normal. Doppler ultrasounds not available at night. Patient refused to get CT of his chest and abdomen tonight. He was given Lovenox 1 mg per KG subcutaneous and scheduled to come back tomorrow afternoon to get Doppler ultrasound of his legs and abdominal ultrasound to check for ascites. Patient was instructed to increase his Lasix from 20 mg a day to 60 mg  once a day.   Labs Review Results for orders placed or performed during the hospital encounter of 01/02/15  Comprehensive metabolic panel  Result Value Ref Range   Sodium 140 135 - 145 mmol/L   Potassium 4.0 3.5 - 5.1 mmol/L   Chloride 107 101 - 111 mmol/L   CO2 26 22 - 32 mmol/L   Glucose, Bld 112 (H) 65 - 99 mg/dL   BUN 14 6 - 20 mg/dL   Creatinine, Ser 0.73 0.61 - 1.24 mg/dL   Calcium 9.0 8.9 - 10.3 mg/dL   Total Protein 7.1 6.5 - 8.1 g/dL   Albumin 4.3 3.5 - 5.0 g/dL   AST 27 15 - 41 U/L   ALT 45 17 - 63 U/L   Alkaline Phosphatase 86 38 - 126 U/L   Total Bilirubin 0.4 0.3 - 1.2 mg/dL   GFR calc non Af Amer >60 >60 mL/min   GFR calc Af Amer >60 >60 mL/min   Anion gap 7 5 - 15  Brain natriuretic peptide  Result Value Ref Range   B Natriuretic Peptide 9.0 0.0 - 100.0 pg/mL  CBC with Differential  Result Value Ref Range   WBC 7.3 4.0 - 10.5 K/uL   RBC 5.20 4.22 - 5.81 MIL/uL   Hemoglobin 15.2 13.0 - 17.0 g/dL   HCT 47.3 39.0 - 52.0 %   MCV 91.0 78.0 - 100.0 fL   MCH 29.2 26.0 - 34.0 pg   MCHC 32.1  30.0 - 36.0 g/dL   RDW 14.9 11.5 - 15.5 %   Platelets 283 150 - 400 K/uL   Neutrophils Relative % 53 43 - 77 %   Neutro Abs 3.9 1.7 - 7.7 K/uL   Lymphocytes Relative 31 12 - 46 %   Lymphs Abs 2.3 0.7 - 4.0 K/uL   Monocytes Relative 12 3 - 12 %   Monocytes Absolute 0.9 0.1 - 1.0 K/uL   Eosinophils Relative 3 0 - 5 %   Eosinophils Absolute 0.2  0.0 - 0.7 K/uL   Basophils Relative 1 0 - 1 %   Basophils Absolute 0.1 0.0 - 0.1 K/uL  Troponin I  Result Value Ref Range   Troponin I <0.03 <0.031 ng/mL  Urinalysis, Routine w reflex microscopic  Result Value Ref Range   Color, Urine YELLOW YELLOW   APPearance CLEAR CLEAR   Specific Gravity, Urine 1.010 1.005 - 1.030   pH 5.5 5.0 - 8.0   Glucose, UA NEGATIVE NEGATIVE mg/dL   Hgb urine dipstick NEGATIVE NEGATIVE   Bilirubin Urine NEGATIVE NEGATIVE   Ketones, ur NEGATIVE NEGATIVE mg/dL   Protein, ur NEGATIVE NEGATIVE mg/dL   Urobilinogen, UA 0.2 0.0 - 1.0 mg/dL   Nitrite NEGATIVE NEGATIVE   Leukocytes, UA NEGATIVE NEGATIVE    Laboratory interpretation all normal except     Imaging Review Dg Chest 2 View  01/02/2015   CLINICAL DATA:  Shortness of breath. Swelling of the legs and feet. Nonproductive cough for 3 days. Smoker. Hypertension.  EXAM: CHEST  2 VIEW  COMPARISON:  07/29/2008  FINDINGS: Mild interstitial accentuation, as can commonly be encountered in smokers. The lungs appear otherwise clear.  Cardiac and mediastinal margins appear normal.  No pleural effusion.  Thoracic spondylosis noted.  IMPRESSION: 1. Mild interstitial accentuation, as can commonly be encountered in smokers. 2. No acute findings. 3. Thoracic spondylosis.   Electronically Signed   By: Van Clines M.D.   On: 01/02/2015 17:46   I have personally reviewed and evaluated these images and lab results as part of my medical decision-making.   EKG Interpretation   Date/Time:  Monday January 02 2015 19:20:37 EDT Ventricular Rate:  89 PR Interval:  136 QRS  Duration: 96 QT Interval:  376 QTC Calculation: 457 R Axis:   94 Text Interpretation:  Sinus rhythm Borderline right axis deviation Since  last tracing rate slower 29 Jul 2008 Confirmed by Marisa Hage  MD-I, Cathline Dowen ([id number])  on 01/02/2015 7:49:58 PM      MDM   Final diagnoses:  Peripheral edema  Abdominal distention    Discharge Medication List as of 01/02/2015 11:10 PM    CONTINUE these medications which have CHANGED   Details  furosemide (LASIX) 20 MG tablet Take 3 tablets (60 mg total) by mouth daily., Starting 01/02/2015, Until Discontinued, Print        Plan discharge  Rolland Porter, MD, Barbette Or, MD 01/03/15 [id number]

## 2015-01-02 NOTE — Discharge Instructions (Signed)
Increase the lasix to 60 mg at one time once a day. Return tomorrow at 1:15 pm to radiology to get outpatient ultrasound of your legs and your abdomen to try to figure out why you are swollen. Do not eat or drink for 6 hrs before your test which would be after 7:30 am. Follow up with Dr Gerarda Fraction later this week.

## 2015-01-02 NOTE — ED Notes (Signed)
Pt was seen for edema of lower ext on 9/9 , started on lasix 20mg s po daily - pt states that this has not helped and feels edema has moved up into his lower abdo - has had some sob, denies hx of CHF

## 2015-01-03 ENCOUNTER — Ambulatory Visit (HOSPITAL_COMMUNITY)
Admit: 2015-01-03 | Discharge: 2015-01-03 | Disposition: A | Discharge status: Home / Self Care | Payer: BLUE CROSS/BLUE SHIELD | Source: Ambulatory Visit | Attending: Emergency Medicine | Admitting: Emergency Medicine

## 2015-01-03 ENCOUNTER — Other Ambulatory Visit (HOSPITAL_COMMUNITY): Payer: Self-pay | Admitting: Emergency Medicine

## 2015-01-03 ENCOUNTER — Ambulatory Visit (HOSPITAL_COMMUNITY)
Admit: 2015-01-03 | Discharge: 2015-01-03 | Disposition: A | Discharge status: Home / Self Care | Payer: BLUE CROSS/BLUE SHIELD | Attending: Emergency Medicine | Admitting: Emergency Medicine

## 2015-01-03 DIAGNOSIS — M79604 Pain in right leg: Secondary | ICD-10-CM | POA: Diagnosis not present

## 2015-01-03 DIAGNOSIS — R140 Abdominal distension (gaseous): Secondary | ICD-10-CM

## 2015-01-03 DIAGNOSIS — M7989 Other specified soft tissue disorders: Secondary | ICD-10-CM | POA: Insufficient documentation

## 2015-01-03 NOTE — ED Provider Notes (Signed)
Patient returned for ER follow-up ultrasound of abdomen and leg as scheduled. Results have been reviewed. Both are negative. Patient reassured, follow-up with primary doctor.  Results for orders placed or performed during the hospital encounter of 01/02/15  Comprehensive metabolic panel  Result Value Ref Range   Sodium 140 135 - 145 mmol/L   Potassium 4.0 3.5 - 5.1 mmol/L   Chloride 107 101 - 111 mmol/L   CO2 26 22 - 32 mmol/L   Glucose, Bld 112 (H) 65 - 99 mg/dL   BUN 14 6 - 20 mg/dL   Creatinine, Ser 0.73 0.61 - 1.24 mg/dL   Calcium 9.0 8.9 - 10.3 mg/dL   Total Protein 7.1 6.5 - 8.1 g/dL   Albumin 4.3 3.5 - 5.0 g/dL   AST 27 15 - 41 U/L   ALT 45 17 - 63 U/L   Alkaline Phosphatase 86 38 - 126 U/L   Total Bilirubin 0.4 0.3 - 1.2 mg/dL   GFR calc non Af Amer >60 >60 mL/min   GFR calc Af Amer >60 >60 mL/min   Anion gap 7 5 - 15  Brain natriuretic peptide  Result Value Ref Range   B Natriuretic Peptide 9.0 0.0 - 100.0 pg/mL  CBC with Differential  Result Value Ref Range   WBC 7.3 4.0 - 10.5 K/uL   RBC 5.20 4.22 - 5.81 MIL/uL   Hemoglobin 15.2 13.0 - 17.0 g/dL   HCT 47.3 39.0 - 52.0 %   MCV 91.0 78.0 - 100.0 fL   MCH 29.2 26.0 - 34.0 pg   MCHC 32.1 30.0 - 36.0 g/dL   RDW 14.9 11.5 - 15.5 %   Platelets 283 150 - 400 K/uL   Neutrophils Relative % 53 43 - 77 %   Neutro Abs 3.9 1.7 - 7.7 K/uL   Lymphocytes Relative 31 12 - 46 %   Lymphs Abs 2.3 0.7 - 4.0 K/uL   Monocytes Relative 12 3 - 12 %   Monocytes Absolute 0.9 0.1 - 1.0 K/uL   Eosinophils Relative 3 0 - 5 %   Eosinophils Absolute 0.2 0.0 - 0.7 K/uL   Basophils Relative 1 0 - 1 %   Basophils Absolute 0.1 0.0 - 0.1 K/uL  Troponin I  Result Value Ref Range   Troponin I <0.03 <0.031 ng/mL  Urinalysis, Routine w reflex microscopic  Result Value Ref Range   Color, Urine YELLOW YELLOW   APPearance CLEAR CLEAR   Specific Gravity, Urine 1.010 1.005 - 1.030   pH 5.5 5.0 - 8.0   Glucose, UA NEGATIVE NEGATIVE mg/dL   Hgb  urine dipstick NEGATIVE NEGATIVE   Bilirubin Urine NEGATIVE NEGATIVE   Ketones, ur NEGATIVE NEGATIVE mg/dL   Protein, ur NEGATIVE NEGATIVE mg/dL   Urobilinogen, UA 0.2 0.0 - 1.0 mg/dL   Nitrite NEGATIVE NEGATIVE   Leukocytes, UA NEGATIVE NEGATIVE   Dg Chest 2 View  01/02/2015   CLINICAL DATA:  Shortness of breath. Swelling of the legs and feet. Nonproductive cough for 3 days. Smoker. Hypertension.  EXAM: CHEST  2 VIEW  COMPARISON:  07/29/2008  FINDINGS: Mild interstitial accentuation, as can commonly be encountered in smokers. The lungs appear otherwise clear.  Cardiac and mediastinal margins appear normal.  No pleural effusion.  Thoracic spondylosis noted.  IMPRESSION: 1. Mild interstitial accentuation, as can commonly be encountered in smokers. 2. No acute findings. 3. Thoracic spondylosis.   Electronically Signed   By: Van Clines M.D.   On: 01/02/2015 17:46  US Abdomen Limited  01/03/2015   CLINICAL DATA:  Abdominal distention for 3 weeks  EXAM: LIMITED ABDOMINAL ULTRASOUND  COMPARISON:  None.  FINDINGS: There is peristalsing bowel throughout the abdomen. No demonstrable ascites. Incidental note is made of diffuse increased echogenicity in the liver consistent with hepatic steatosis.  IMPRESSION: No demonstrable ascites. Evidence of hepatic steatosis. No focal liver lesions are identified. The sensitivity of ultrasound for focal liver lesion detection is diminished in this circumstance.   Electronically Signed   By: Lowella Grip III M.D.   On: 01/03/2015 14:07   US Venous Img Lower Unilateral Right  01/03/2015   CLINICAL DATA:  Right lower extremity pain and swelling for 3 weeks.  EXAM: RIGHT LOWER EXTREMITY VENOUS DOPPLER ULTRASOUND  TECHNIQUE: Gray-scale sonography with graded compression, as well as color Doppler and duplex ultrasound were performed to evaluate the lower extremity deep venous systems from the level of the common femoral vein and including the common femoral,  femoral, profunda femoral, popliteal and calf veins including the posterior tibial, peroneal and gastrocnemius veins when visible. The superficial great saphenous vein was also interrogated. Spectral Doppler was utilized to evaluate flow at rest and with distal augmentation maneuvers in the common femoral, femoral and popliteal veins.  COMPARISON:  None.  FINDINGS: Deep venous system appears patent and compressible from groin through popliteal fossae.  Spontaneous venous flow present with evidence of respiratory phasicity. Augmentation intact.  No intraluminal thrombus identified.  Visualized portions of the greater saphenous veins patent.  The left common femoral vein is patent without DVT.  IMPRESSION: No evidence of deep venous thrombosis.   Electronically Signed   By: Margarette Canada M.D.   On: 01/03/2015 14:28      Orpah Greek, MD 01/03/15 1436

## 2017-01-26 ENCOUNTER — Encounter (HOSPITAL_COMMUNITY): Payer: Self-pay | Admitting: *Deleted

## 2017-01-26 ENCOUNTER — Emergency Department (HOSPITAL_COMMUNITY)
Admission: EM | Admit: 2017-01-26 | Discharge: 2017-01-26 | Disposition: A | Discharge status: Home / Self Care | Payer: BLUE CROSS/BLUE SHIELD | Attending: Emergency Medicine | Admitting: Emergency Medicine

## 2017-01-26 DIAGNOSIS — Z23 Encounter for immunization: Secondary | ICD-10-CM | POA: Insufficient documentation

## 2017-01-26 DIAGNOSIS — Y998 Other external cause status: Secondary | ICD-10-CM | POA: Insufficient documentation

## 2017-01-26 DIAGNOSIS — F1010 Alcohol abuse, uncomplicated: Secondary | ICD-10-CM | POA: Insufficient documentation

## 2017-01-26 DIAGNOSIS — Y929 Unspecified place or not applicable: Secondary | ICD-10-CM | POA: Insufficient documentation

## 2017-01-26 DIAGNOSIS — Y9389 Activity, other specified: Secondary | ICD-10-CM | POA: Insufficient documentation

## 2017-01-26 DIAGNOSIS — Z79899 Other long term (current) drug therapy: Secondary | ICD-10-CM | POA: Insufficient documentation

## 2017-01-26 DIAGNOSIS — S91012A Laceration without foreign body, left ankle, initial encounter: Secondary | ICD-10-CM | POA: Insufficient documentation

## 2017-01-26 DIAGNOSIS — W260XXA Contact with knife, initial encounter: Secondary | ICD-10-CM | POA: Insufficient documentation

## 2017-01-26 DIAGNOSIS — I10 Essential (primary) hypertension: Secondary | ICD-10-CM | POA: Insufficient documentation

## 2017-01-26 DIAGNOSIS — F17210 Nicotine dependence, cigarettes, uncomplicated: Secondary | ICD-10-CM | POA: Insufficient documentation

## 2017-01-26 LAB — CBG MONITORING, ED: GLUCOSE-CAPILLARY: 192 mg/dL — AB (ref 65–99)

## 2017-01-26 MED ORDER — LIDOCAINE HCL (PF) 2 % IJ SOLN
5.0000 mL | Freq: Once | INTRAMUSCULAR | Status: AC
  Administered 2017-01-26: 5 mL via INTRADERMAL

## 2017-01-26 MED ORDER — TETANUS-DIPHTH-ACELL PERTUSSIS 5-2.5-18.5 LF-MCG/0.5 IM SUSP
0.5000 mL | Freq: Once | INTRAMUSCULAR | Status: AC
  Administered 2017-01-26: 0.5 mL via INTRAMUSCULAR
  Filled 2017-01-26: qty 0.5

## 2017-01-26 MED ORDER — LIDOCAINE HCL (PF) 2 % IJ SOLN
INTRAMUSCULAR | Status: AC
  Filled 2017-01-26: qty 10

## 2017-01-26 NOTE — ED Notes (Signed)
Pt reports using a knife a home when it dropped and hit his ankle  3/4 in-1 in cut to ankle with bleeding controlled  Snacks provided  Awaiting repair

## 2017-01-26 NOTE — Discharge Instructions (Signed)
Elevate your leg when possible.  Clean the wound with mild soap and water and keep it bandaged.  Sutures out in 8-10 days.  Return here for any signs of infection

## 2017-01-26 NOTE — ED Provider Notes (Signed)
[facility] DEPT Provider Note   CSN: [id number] Arrival date & time: 01/26/17  2042     History   Chief Complaint Chief Complaint  Patient presents with  . Laceration    HPI JYQUAN KENLEY is a 56 y.o. male.  HPI   DRAE MITZEL is a 56 y.o. male who presents to the Emergency Department complaining of laceration to the left ankle.  States that he was cleaning a knife when it slipped from his hand and landed on his ankle.  Bleeding controlled prior to arrival using pressure.  He denies numbness or difficulty moving the ankle.  Also denies blood thinners.  Admits to alcohol use.  Last Td is unknown   Past Medical History:  Diagnosis Date  . Hypertension     There are no active problems to display for this patient.   Past Surgical History:  Procedure Laterality Date  . EYE SURGERY         Home Medications    Prior to Admission medications   Medication Sig Start Date End Date Taking? Authorizing Provider  furosemide (LASIX) 20 MG tablet Take 3 tablets (60 mg total) by mouth daily. 01/02/15   Rolland Porter, MD  Multiple Vitamin (ONE-A-DAY MENS PO) Take 1 tablet by mouth daily.      [provider]    Family History Family History  Problem Relation Age of Onset  . Lupus Mother   . Rheum arthritis Mother   . Diabetes Mother     Social History Social History  Substance Use Topics  . Smoking status: Current Every Day Smoker    Packs/day: 2.00    Types: Cigarettes  . Smokeless tobacco: Never Used  . Alcohol use Yes     Allergies   Patient has no known allergies.   Review of Systems Review of Systems  Constitutional: Negative for chills and fever.  Musculoskeletal: Negative for arthralgias, back pain and joint swelling.  Skin: Positive for wound.       Laceration ankle  Neurological: Negative for dizziness, weakness and numbness.  Hematological: Does not bruise/bleed easily.  All other systems reviewed and are negative.    Physical  Exam Updated Vital Signs BP (!) 142/74   Pulse 87   Temp 98.5 F (36.9 C)   Resp 16   Ht 5' 6.5" (1.689 m)   Wt 102.1 kg (225 lb)   SpO2 100%   BMI 35.77 kg/m   Physical Exam  Constitutional: He is oriented to person, place, and time. He appears well-developed and well-nourished. No distress.  HENT:  Head: Normocephalic and atraumatic.  Cardiovascular: Normal rate, regular rhythm and intact distal pulses.   No murmur heard. Pulmonary/Chest: Effort normal and breath sounds normal. No respiratory distress.  Musculoskeletal: Normal range of motion. He exhibits tenderness. He exhibits no edema.  Neurological: He is alert and oriented to person, place, and time. He exhibits normal muscle tone. Coordination normal.  Skin: Skin is warm. Capillary refill takes less than 2 seconds. Laceration noted.  Laceration to the medial left ankle.  No edema.  No hematoma  Nursing note and vitals reviewed.    ED Treatments / Results  Labs (all labs ordered are listed, but only abnormal results are displayed) Labs Reviewed  CBG MONITORING, ED - Abnormal; Notable for the following:       Result Value   Glucose-Capillary 192 (*)    All other components within normal limits    EKG  EKG Interpretation None  Radiology No results found.  Procedures Procedures (including critical care time)  LACERATION REPAIR Performed by: [place] Lesure L. Authorized by: Hale Bogus Consent: Verbal consent obtained. Risks and benefits: risks, benefits and alternatives were discussed Consent given by: patient Patient identity confirmed: provided demographic data Prepped and Draped in normal sterile fashion Wound explored  Laceration Location: medial left ankle  Laceration Length: 2 cm  No Foreign Bodies seen or palpated  Anesthesia: local infiltration  Local anesthetic: lidocaine 2% w/o epinephrine  Anesthetic total: 2 ml  Irrigation method: syringe Amount of cleaning:  standard  Skin closure: 4-0 ethilon  Number of sutures: 3  Technique: simple interrupted  Patient tolerance: Patient tolerated the procedure well with no immediate complications.   Medications Ordered in ED Medications  Tdap (BOOSTRIX) injection 0.5 mL (0.5 mLs Intramuscular Given 01/26/17 2210)  lidocaine (XYLOCAINE) 2 % injection 5 mL (5 mLs Intradermal Given 01/26/17 2211)     Initial Impression / Assessment and Plan / ED Course  I have reviewed the triage vital signs and the nursing notes.  Pertinent labs & imaging results that were available during my care of the patient were reviewed by me and considered in my medical decision making (see chart for details).     Td updated. NV intact.  No motor weakness.   ASO applied for support.  Pt d/c to family member at bedside.  Pt agrees to wound care instructions, sutures out in 8-10 days.  Return precautions discussed.  Final Clinical Impressions(s) / ED Diagnoses   Final diagnoses:  Laceration of left ankle, initial encounter    New Prescriptions New Prescriptions   No medications on file     Kem Parkinson, Hershal Coria 01/29/17 2322    Nat Christen, MD 01/31/17 1048

## 2017-01-26 NOTE — ED Triage Notes (Signed)
Pt was cleaning his knife when it slipped and landed on left ankle area causing a laceration, bleeding controlled with pressure bandage, pt smells of etoh use,

## 2017-01-26 NOTE — ED Notes (Signed)
To FT for repair in room rather than hallway- Judson Roch is aware as is TT who has seen

## 2018-11-06 ENCOUNTER — Other Ambulatory Visit: Payer: Self-pay

## 2018-11-06 DIAGNOSIS — Z20822 Contact with and (suspected) exposure to covid-19: Secondary | ICD-10-CM

## 2018-11-10 LAB — NOVEL CORONAVIRUS, NAA: SARS-CoV-2, NAA: NOT DETECTED

## 2020-11-02 ENCOUNTER — Telehealth: Payer: Self-pay

## 2020-11-02 NOTE — Telephone Encounter (Signed)
Followed up with pt by phone with the attempt to get him scheduled with the [facility] to establish care, but unsuccessful at the time of call to reach pt.  Plan -left voice message with contact information of Mayra Reel, RN Case Manager, if unable to reach me once returning call to receive assistance with scheduling his first medical appt with the [facility]

## 2020-11-02 NOTE — Telephone Encounter (Signed)
Client recently enrolled with [facility]. Attempted call to assist client to schedule an appointment to establish care with [facility]. No answer, left voicemail requesting return call.  [place] [employer]

## 2020-11-03 ENCOUNTER — Telehealth: Payer: Self-pay

## 2020-11-03 NOTE — Telephone Encounter (Signed)
Daughter of client called to schedule client's appointment to establish care with [facility]. Appointment made for 11/13/20 at 1:15PM. Plan: Follow up by phone after the first appointment.  [place] [employer]

## 2020-11-13 ENCOUNTER — Ambulatory Visit: Payer: Self-pay | Admitting: Physician Assistant

## 2020-11-13 ENCOUNTER — Encounter: Payer: Self-pay | Admitting: Physician Assistant

## 2020-11-13 VITALS — BP 181/93 | HR 90 | Ht 66.0 in | Wt 240.0 lb

## 2020-11-13 DIAGNOSIS — R609 Edema, unspecified: Secondary | ICD-10-CM

## 2020-11-13 DIAGNOSIS — L304 Erythema intertrigo: Secondary | ICD-10-CM

## 2020-11-13 DIAGNOSIS — L821 Other seborrheic keratosis: Secondary | ICD-10-CM

## 2020-11-13 DIAGNOSIS — R2242 Localized swelling, mass and lump, left lower limb: Secondary | ICD-10-CM

## 2020-11-13 DIAGNOSIS — Z125 Encounter for screening for malignant neoplasm of prostate: Secondary | ICD-10-CM

## 2020-11-13 DIAGNOSIS — Z8639 Personal history of other endocrine, nutritional and metabolic disease: Secondary | ICD-10-CM

## 2020-11-13 DIAGNOSIS — F17200 Nicotine dependence, unspecified, uncomplicated: Secondary | ICD-10-CM

## 2020-11-13 DIAGNOSIS — D492 Neoplasm of unspecified behavior of bone, soft tissue, and skin: Secondary | ICD-10-CM

## 2020-11-13 DIAGNOSIS — Z13220 Encounter for screening for lipoid disorders: Secondary | ICD-10-CM

## 2020-11-13 DIAGNOSIS — Z7689 Persons encountering health services in other specified circumstances: Secondary | ICD-10-CM

## 2020-11-13 DIAGNOSIS — I10 Essential (primary) hypertension: Secondary | ICD-10-CM

## 2020-11-13 DIAGNOSIS — H9190 Unspecified hearing loss, unspecified ear: Secondary | ICD-10-CM

## 2020-11-13 DIAGNOSIS — E669 Obesity, unspecified: Secondary | ICD-10-CM

## 2020-11-13 DIAGNOSIS — J449 Chronic obstructive pulmonary disease, unspecified: Secondary | ICD-10-CM

## 2020-11-13 LAB — GLUCOSE, POCT (MANUAL RESULT ENTRY): POC Glucose: 136 mg/dl — AB (ref 70–99)

## 2020-11-13 MED ORDER — ALBUTEROL SULFATE (2.5 MG/3ML) 0.083% IN NEBU: 2.5000 mg | INHALATION_SOLUTION | Freq: Four times a day (QID) | RESPIRATORY_TRACT | 0 refills | Status: DC | PRN

## 2020-11-13 MED ORDER — LOSARTAN POTASSIUM 50 MG PO TABS: 50.0000 mg | ORAL_TABLET | Freq: Every day | ORAL | 0 refills | Status: DC

## 2020-11-13 MED ORDER — ALBUTEROL SULFATE HFA 108 (90 BASE) MCG/ACT IN AERS: 2.0000 | INHALATION_SPRAY | Freq: Four times a day (QID) | RESPIRATORY_TRACT | 0 refills | Status: DC | PRN

## 2020-11-13 NOTE — Progress Notes (Signed)
BP (!) 181/93   Pulse 90   Ht '5\' 6"'$  (1.676 m)   Wt 240 lb (108.9 kg)   SpO2 95%   BMI 38.74 kg/m    Subjective:    Patient ID: Thomas Johns, male    DOB: Aug 09, 1960, 60 y.o.   MRN: [id number]  HPI: Thomas Johns is a 60 y.o. male presenting on 11/13/2020 for Establish Care   HPI  Pt had a negative covid 19 screening questionnaire.  Chief Complaint  Patient presents with   Establish Care    Pt's niece is with him today.  She helps him with his bills and other things that he needs help to do.    Pt worked Engineer, structural until  5 years ago.  He Went to [employer] until about 4 years ago.  They have many concerns today.  He reports a Growth on inner thigh x 3 years. He says he was told it was a fatty tumor but it bothers him a lot and he thinks Maybe it's getting bigger .  No pain or drainage.   Pt says he was was put on some medication by cardiology that he stopped due to it kept him awake so he stopped it.  Pt says he had LHC at [facility] but records not in epic.    Pt reports history htn and dm and he took meds in the past for both.  He used oral agents for his dm.    Pt says he is not having any chest pains.  He does report sob.  He used nebulizer in the past that helped a lot but he has been out of that.    Other issues: Toenail fungus Swelling [facility]- niece says that evaluation might help disabillity          Relevant past medical, surgical, family and social history reviewed and updated as indicated. Interim medical history since our last visit reviewed. Allergies and medications reviewed and updated.    No current outpatient medications on file.     Review of Systems  Per HPI unless specifically indicated above     Objective:    BP (!) 181/93   Pulse 90   Ht '5\' 6"'$  (1.676 m)   Wt 240 lb (108.9 kg)   SpO2 95%   BMI 38.74 kg/m   Wt Readings from Last 3 Encounters:  11/13/20 240 lb (108.9 kg)  01/26/17 225  lb (102.1 kg)  01/02/15 188 lb 14.4 oz (85.7 kg)    Physical Exam Vitals reviewed.  Constitutional:      General: He is not in acute distress.    Appearance: He is well-developed. He is obese. He is not ill-appearing.  HENT:     Head: Normocephalic and atraumatic.     Right Ear: Tympanic membrane, ear canal and external ear normal. Decreased hearing noted.     Left Ear: Tympanic membrane, ear canal and external ear normal. Decreased hearing noted.  Eyes:     Extraocular Movements: Extraocular movements intact.     Conjunctiva/sclera: Conjunctivae normal.     Pupils: Pupils are equal, round, and reactive to light.  Neck:     Thyroid: No thyromegaly.  Cardiovascular:     Rate and Rhythm: Normal rate and regular rhythm.  Pulmonary:     Effort: Pulmonary effort is normal.     Breath sounds: Normal breath sounds. No wheezing or rales.  Abdominal:     General: Bowel sounds  are normal.     Palpations: Abdomen is soft. There is no mass.     Tenderness: There is no abdominal tenderness.  Genitourinary:    Comments: Large soft mass left groin. Unable to ascertain if this is more a groin or thigh issue.  It is approximately 10cm in length.  It is nontender.  There are no definite borders.  Left groin with very red rash in the skin fold.   Musculoskeletal:     Cervical back: Neck supple.     Right lower leg: Edema present.     Left lower leg: Edema present.  Feet:     Comments: No open sores of the feet Lymphadenopathy:     Cervical: No cervical adenopathy.  Skin:    General: Skin is warm and dry.     Findings: No rash.     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  Very large approximately 4cm pedunculated lesion left tricep area consistent with seborrheic keratosis.  Neurological:     Mental Status: He is alert and oriented to person, place, and time.  Psychiatric:        Attention and Perception: Attention normal.        Mood and  Affect: Mood is not anxious or depressed.        Behavior: Behavior normal. Behavior is cooperative.     Comments: Pt is very pleasant and engages easily.   Denies depression.     Blood sugar 136 (random)              Assessment & Plan:   Encounter Diagnoses  Name Primary?   Encounter to establish care Yes   Primary hypertension    Chronic obstructive pulmonary disease, unspecified COPD type ([place])    Tobacco use disorder    Hearing loss, unspecified hearing loss type, unspecified laterality    History of diabetes mellitus    Screening for prostate cancer    Screening cholesterol level    Obesity, unspecified classification, unspecified obesity type, unspecified whether serious comorbidity present    Abnormal skin growth    Seborrheic keratosis    Mass of left thigh    Intertrigo       -refer To dermatology for abnormal skin lesions  -pt to get Baseline labs  -educated and Encouraged pt to get Covid vaccination  -Rx losartan for htn  & Inhaler & nebs to [facility] the mail order pharmacy.  Losartan and nebs to local pharmacy so he can start them today  -pt will be put on Dental list  -If labs reveal Diabetes,  will refer for dm eye exam  -will get [place] groin/thigh mass  -pt is given [facility] financial assistance application for the [place]  -pt and niece were counseled on care for the intertrigo.   -encouraged Smoking cessation and gave handouts  -Will discuss evaluation for OSA at next appt  -Pt's niece will try to locate cardiology records of [place].   -Pt to follow up 4 wk

## 2020-11-14 ENCOUNTER — Other Ambulatory Visit (HOSPITAL_COMMUNITY): Payer: Self-pay | Admitting: Physician Assistant

## 2020-11-14 ENCOUNTER — Other Ambulatory Visit: Payer: Self-pay

## 2020-11-14 DIAGNOSIS — R2242 Localized swelling, mass and lump, left lower limb: Secondary | ICD-10-CM

## 2020-11-16 ENCOUNTER — Telehealth: Payer: Self-pay

## 2020-11-16 NOTE — Telephone Encounter (Signed)
Attempted call to follow up with client. No answer, left voicemail.  [place] [employer]

## 2020-11-21 ENCOUNTER — Other Ambulatory Visit: Payer: Self-pay

## 2020-11-21 ENCOUNTER — Ambulatory Visit (HOSPITAL_COMMUNITY)
Admission: RE | Admit: 2020-11-21 | Discharge: 2020-11-21 | Disposition: A | Discharge status: Home / Self Care | Payer: Self-pay | Source: Ambulatory Visit | Attending: Physician Assistant | Admitting: Physician Assistant

## 2020-11-21 ENCOUNTER — Other Ambulatory Visit (HOSPITAL_COMMUNITY)
Admission: RE | Admit: 2020-11-21 | Discharge: 2020-11-21 | Disposition: A | Discharge status: Home / Self Care | Payer: Self-pay | Source: Ambulatory Visit | Attending: Physician Assistant | Admitting: Physician Assistant

## 2020-11-21 DIAGNOSIS — Z8639 Personal history of other endocrine, nutritional and metabolic disease: Secondary | ICD-10-CM | POA: Insufficient documentation

## 2020-11-21 DIAGNOSIS — Z13220 Encounter for screening for lipoid disorders: Secondary | ICD-10-CM | POA: Insufficient documentation

## 2020-11-21 DIAGNOSIS — Z125 Encounter for screening for malignant neoplasm of prostate: Secondary | ICD-10-CM | POA: Insufficient documentation

## 2020-11-21 DIAGNOSIS — R2242 Localized swelling, mass and lump, left lower limb: Secondary | ICD-10-CM | POA: Insufficient documentation

## 2020-11-21 DIAGNOSIS — I10 Essential (primary) hypertension: Secondary | ICD-10-CM | POA: Insufficient documentation

## 2020-11-21 LAB — LIPID PANEL
Cholesterol: 148 mg/dL (ref 0–200)
HDL: 21 mg/dL — ABNORMAL LOW (ref >40)
LDL Cholesterol: 93 mg/dL (ref 0–99)
Total CHOL/HDL Ratio: 7 RATIO
Triglycerides: 171 mg/dL — ABNORMAL HIGH (ref <150)
VLDL: 34 mg/dL (ref 0–40)

## 2020-11-21 LAB — COMPREHENSIVE METABOLIC PANEL
ALT: 30 U/L (ref 0–44)
AST: 18 U/L (ref 15–41)
Albumin: 3.7 g/dL (ref 3.5–5.0)
Alkaline Phosphatase: 114 U/L (ref 38–126)
Anion gap: 5 (ref 5–15)
BUN: 8 mg/dL (ref 6–20)
CO2: 29 mmol/L (ref 22–32)
Calcium: 8.3 mg/dL — ABNORMAL LOW (ref 8.9–10.3)
Chloride: 105 mmol/L (ref 98–111)
Creatinine, Ser: 0.86 mg/dL (ref 0.61–1.24)
GFR, Estimated: >60 mL/min (ref >60)
Glucose, Bld: 173 mg/dL — ABNORMAL HIGH (ref 70–99)
Potassium: 3.4 mmol/L — ABNORMAL LOW (ref 3.5–5.1)
Sodium: 139 mmol/L (ref 135–145)
Total Bilirubin: 0.5 mg/dL (ref 0.3–1.2)
Total Protein: 6.5 g/dL (ref 6.5–8.1)

## 2020-11-21 LAB — PSA: Prostatic Specific Antigen: 0.16 ng/mL (ref 0.00–4.00)

## 2020-11-22 LAB — HEMOGLOBIN A1C
Hgb A1c MFr Bld: 8.4 % — ABNORMAL HIGH (ref 4.8–5.6)
Mean Plasma Glucose: 194 mg/dL

## 2020-12-11 ENCOUNTER — Ambulatory Visit: Payer: Self-pay | Admitting: Physician Assistant

## 2020-12-11 VITALS — BP 152/79 | HR 94 | Temp 97.3°F | Wt 239.0 lb

## 2020-12-11 DIAGNOSIS — R2242 Localized swelling, mass and lump, left lower limb: Secondary | ICD-10-CM

## 2020-12-11 DIAGNOSIS — L821 Other seborrheic keratosis: Secondary | ICD-10-CM

## 2020-12-11 DIAGNOSIS — E669 Obesity, unspecified: Secondary | ICD-10-CM

## 2020-12-11 DIAGNOSIS — L304 Erythema intertrigo: Secondary | ICD-10-CM

## 2020-12-11 DIAGNOSIS — H9190 Unspecified hearing loss, unspecified ear: Secondary | ICD-10-CM

## 2020-12-11 DIAGNOSIS — Z86010 Personal history of colonic polyps: Secondary | ICD-10-CM

## 2020-12-11 DIAGNOSIS — I10 Essential (primary) hypertension: Secondary | ICD-10-CM

## 2020-12-11 DIAGNOSIS — E1165 Type 2 diabetes mellitus with hyperglycemia: Secondary | ICD-10-CM

## 2020-12-11 DIAGNOSIS — K089 Disorder of teeth and supporting structures, unspecified: Secondary | ICD-10-CM

## 2020-12-11 DIAGNOSIS — D492 Neoplasm of unspecified behavior of bone, soft tissue, and skin: Secondary | ICD-10-CM

## 2020-12-11 DIAGNOSIS — F17200 Nicotine dependence, unspecified, uncomplicated: Secondary | ICD-10-CM

## 2020-12-11 DIAGNOSIS — R0683 Snoring: Secondary | ICD-10-CM

## 2020-12-11 DIAGNOSIS — J449 Chronic obstructive pulmonary disease, unspecified: Secondary | ICD-10-CM

## 2020-12-11 DIAGNOSIS — R609 Edema, unspecified: Secondary | ICD-10-CM

## 2020-12-11 MED ORDER — ATORVASTATIN CALCIUM 20 MG PO TABS: 20.0000 mg | ORAL_TABLET | Freq: Every day | ORAL | 0 refills | Status: DC

## 2020-12-11 MED ORDER — POTASSIUM CHLORIDE CRYS ER 10 MEQ PO TBCR: EXTENDED_RELEASE_TABLET | ORAL | 0 refills | Status: DC

## 2020-12-11 MED ORDER — METFORMIN HCL 500 MG PO TABS: 500.0000 mg | ORAL_TABLET | Freq: Two times a day (BID) | ORAL | 0 refills | Status: DC

## 2020-12-11 MED ORDER — DULERA 50-5 MCG/ACT IN AERO: 2.0000 | INHALATION_SPRAY | Freq: Two times a day (BID) | RESPIRATORY_TRACT | 0 refills | Status: DC

## 2020-12-11 MED ORDER — FUROSEMIDE 20 MG PO TABS: 20.0000 mg | ORAL_TABLET | Freq: Every day | ORAL | 0 refills | Status: DC | PRN

## 2020-12-11 MED ORDER — MICONAZOLE NITRATE 2 % EX CREA: 1.0000 "application " | TOPICAL_CREAM | Freq: Two times a day (BID) | CUTANEOUS | 1 refills | Status: DC

## 2020-12-11 MED ORDER — LOSARTAN POTASSIUM 100 MG PO TABS: 100.0000 mg | ORAL_TABLET | Freq: Every day | ORAL | 0 refills | Status: DC

## 2020-12-11 NOTE — Patient Instructions (Signed)
  Refer to GI for hx polyps Dermatologty as scheduled. Restart metformin 500 for dm Start atorvastatin for cholesterol Increase losartan to 100 MRI groin mass Check on cafa Refer for DM eye exam He is on dental list Refer for sleep study -  Encouraged covid vaccination' Advair Advair PAP Smoking cut back Lasix 20 and k+

## 2020-12-11 NOTE — Progress Notes (Signed)
BP (!) 152/79   Pulse 94   Temp (!) 97.3 F (36.3 C)   Wt 239 lb (108.4 kg)   SpO2 93%   BMI 38.58 kg/m    Subjective:    Patient ID: Thomas Johns, male    DOB: [birth year], 60 y.o.   MRN: [id number]  HPI:    HPI   Pt had a negative covid 19 screening questionnaire.    Pt is 32yoM who presents for Follow up HTN and COPD.  His niece is with him today.   Pt rescheduled his appointment with dermatology; he is going in September  Pt has records with him of Colonoscopy 2004 which revealed polyps  He also has records for [facility] 2010- normal  Pt says his Intertrigo is improved      Relevant past medical, surgical, family and social history reviewed and updated as indicated. Interim medical history since our last visit reviewed. Allergies and medications reviewed and updated.    Current Outpatient Medications:    albuterol (PROVENTIL) (2.5 MG/3ML) 0.083% nebulizer solution, Take 3 mLs (2.5 mg total) by nebulization every 6 (six) hours as needed for wheezing or shortness of breath., Disp: 75 mL, Rfl: 0   albuterol (VENTOLIN HFA) 108 (90 Base) MCG/ACT inhaler, Inhale 2 puffs into the lungs every 6 (six) hours as needed for wheezing or shortness of breath., Disp: 3 each, Rfl: 0   losartan (COZAAR) 50 MG tablet, Take 1 tablet (50 mg total) by mouth daily., Disp: 90 tablet, Rfl: 0    Review of Systems  Per HPI unless specifically indicated above     Objective:    BP (!) 152/79   Pulse 94   Temp (!) 97.3 F (36.3 C)   Wt 239 lb (108.4 kg)   SpO2 93%   BMI 38.58 kg/m   Wt Readings from Last 3 Encounters:  12/11/20 239 lb (108.4 kg)  11/13/20 240 lb (108.9 kg)  01/26/17 225 lb (102.1 kg)    Physical Exam Vitals reviewed.  Constitutional:      General: He is not in acute distress.    Appearance: He is well-developed. He is obese. He is not ill-appearing.  HENT:     Head: Normocephalic and atraumatic.  Cardiovascular:     Rate and Rhythm: Normal rate and  regular rhythm.  Pulmonary:     Effort: Pulmonary effort is normal.     Breath sounds: Normal breath sounds. No wheezing.  Abdominal:     General: Bowel sounds are normal.     Palpations: Abdomen is soft.     Tenderness: There is no abdominal tenderness.  Musculoskeletal:     Cervical back: Neck supple.     Right lower leg: No edema.     Left lower leg: No edema.  Lymphadenopathy:     Cervical: No cervical adenopathy.  Skin:    General: Skin is warm and dry.  Neurological:     Mental Status: He is alert and oriented to person, place, and time.  Psychiatric:        Behavior: Behavior normal.    Results for orders placed or performed during the hospital encounter of 11/21/20  PSA  Result Value Ref Range   Prostatic Specific Antigen 0.16 0.00 - 4.00 ng/mL  Hemoglobin A1c  Result Value Ref Range   Hgb A1c MFr Bld 8.4 (H) 4.8 - 5.6 %   Mean Plasma Glucose 194 mg/dL  Lipid panel  Result Value Ref Range   Cholesterol 148  0 - 200 mg/dL   Triglycerides 171 (H) <150 mg/dL   HDL 21 (L) >40 mg/dL   Total CHOL/HDL Ratio 7.0 RATIO   VLDL 34 0 - 40 mg/dL   LDL Cholesterol 93 0 - 99 mg/dL  Comprehensive metabolic panel  Result Value Ref Range   Sodium 139 135 - 145 mmol/L   Potassium 3.4 (L) 3.5 - 5.1 mmol/L   Chloride 105 98 - 111 mmol/L   CO2 29 22 - 32 mmol/L   Glucose, Bld 173 (H) 70 - 99 mg/dL   BUN 8 6 - 20 mg/dL   Creatinine, Ser 0.86 0.61 - 1.24 mg/dL   Calcium 8.3 (L) 8.9 - 10.3 mg/dL   Total Protein 6.5 6.5 - 8.1 g/dL   Albumin 3.7 3.5 - 5.0 g/dL   AST 18 15 - 41 U/L   ALT 30 0 - 44 U/L   Alkaline Phosphatase 114 38 - 126 U/L   Total Bilirubin 0.5 0.3 - 1.2 mg/dL   GFR, Estimated >60 >60 mL/min   Anion gap 5 5 - 15      Assessment & Plan:    Encounter Diagnoses  Name Primary?   Primary hypertension Yes   Chronic obstructive pulmonary disease, unspecified COPD type ([place])    Tobacco use disorder    Hearing loss, unspecified hearing loss type, unspecified  laterality    Uncontrolled type 2 diabetes mellitus with hyperglycemia ([facility])    Obesity, unspecified classification, unspecified obesity type, unspecified whether serious comorbidity present    Seborrheic keratosis    Abnormal skin growth    History of colon polyps    Dental disease    Mass of left thigh    Intertrigo    Edema, unspecified type    Snoring        -reViewed labs with pt    Diabetes type 2 -Restart metformin '500mg'$  bid and follow diabetic diet -Start atorvastatin -Refer for DM eye exam  2. HTN -improved.  Will Increase losartan to '100mg'$  daily  3. History colon polyps -Refer to GI for hx polyps  4.Basal cell and seborrheic keratosis -pt to see Dermatology as scheduled.  5. Groin Mass -Order MRI as recommended by [place] to further evaluate the groin mass  6. COPD/smoking -rx Advair.  Pt is given patient assistance application for the medication to complete and return to office.  He was given sample dulera to use until he receives his advair. -pt is encouraged to cut back on smoking or stop altogether  7. Edema- no recent changes -Rx Lasix 20 and k+  8. Intertrigo -rx Miconazole and counseling for intertrigo.  He was given reading information on this  9. Financial -Will Check on [facility] financial assisstance  10. Dental decay He is on dental list  11. At risk OSA -Refer for sleep study -   12. HCM -Encouraged covid vaccination'    Pt to follow up 6 weeks.  He is to contact office sooner prn

## 2020-12-12 ENCOUNTER — Other Ambulatory Visit: Payer: Self-pay | Admitting: Physician Assistant

## 2020-12-12 MED ORDER — ADVAIR HFA 115-21 MCG/ACT IN AERO: 2.0000 | INHALATION_SPRAY | Freq: Two times a day (BID) | RESPIRATORY_TRACT | 3 refills | Status: DC

## 2020-12-19 ENCOUNTER — Encounter: Payer: Self-pay | Admitting: Physician Assistant

## 2020-12-20 ENCOUNTER — Encounter: Payer: Self-pay | Admitting: Internal Medicine

## 2020-12-21 ENCOUNTER — Telehealth: Payer: Self-pay

## 2020-12-21 NOTE — Telephone Encounter (Signed)
Pt notified of MRI scheduled January 01, 2021 at 3:30pm & advised it is requested he arrive at 3:15pm.

## 2021-01-01 ENCOUNTER — Ambulatory Visit (HOSPITAL_COMMUNITY)
Admission: RE | Admit: 2021-01-01 | Discharge: 2021-01-01 | Disposition: A | Discharge status: Home / Self Care | Payer: Self-pay | Source: Ambulatory Visit | Attending: Physician Assistant | Admitting: Physician Assistant

## 2021-01-01 ENCOUNTER — Other Ambulatory Visit: Payer: Self-pay

## 2021-01-01 DIAGNOSIS — R2242 Localized swelling, mass and lump, left lower limb: Secondary | ICD-10-CM | POA: Insufficient documentation

## 2021-01-01 MED ORDER — GADOBUTROL 1 MMOL/ML IV SOLN
10.0000 mL | Freq: Once | INTRAVENOUS | Status: AC | PRN
  Administered 2021-01-01: 10 mL via INTRAVENOUS

## 2021-01-11 ENCOUNTER — Encounter: Payer: Self-pay | Admitting: Internal Medicine

## 2021-01-11 ENCOUNTER — Ambulatory Visit: Payer: Self-pay

## 2021-01-22 ENCOUNTER — Other Ambulatory Visit: Payer: Self-pay

## 2021-01-22 ENCOUNTER — Ambulatory Visit: Payer: Self-pay | Admitting: Physician Assistant

## 2021-01-22 ENCOUNTER — Encounter: Payer: Self-pay | Admitting: Physician Assistant

## 2021-01-22 VITALS — BP 155/92 | HR 81 | Temp 97.6°F | Wt 245.0 lb

## 2021-01-22 DIAGNOSIS — F17200 Nicotine dependence, unspecified, uncomplicated: Secondary | ICD-10-CM

## 2021-01-22 DIAGNOSIS — H9190 Unspecified hearing loss, unspecified ear: Secondary | ICD-10-CM

## 2021-01-22 DIAGNOSIS — I10 Essential (primary) hypertension: Secondary | ICD-10-CM

## 2021-01-22 DIAGNOSIS — E1165 Type 2 diabetes mellitus with hyperglycemia: Secondary | ICD-10-CM

## 2021-01-22 DIAGNOSIS — E669 Obesity, unspecified: Secondary | ICD-10-CM

## 2021-01-22 DIAGNOSIS — J449 Chronic obstructive pulmonary disease, unspecified: Secondary | ICD-10-CM

## 2021-01-22 DIAGNOSIS — D1724 Benign lipomatous neoplasm of skin and subcutaneous tissue of left leg: Secondary | ICD-10-CM

## 2021-01-22 DIAGNOSIS — Z86010 Personal history of colonic polyps: Secondary | ICD-10-CM

## 2021-01-22 NOTE — Progress Notes (Signed)
BP (!) 155/92   Pulse 81   Temp 97.6 F (36.4 C)   Wt 245 lb (111.1 kg)   SpO2 95%   BMI 39.54 kg/m    Subjective:    Patient ID: Thomas Johns, male    DOB: 1961-02-02, 61 y.o.   MRN: [id number]  HPI: GABRIELLE MESTER is a 60 y.o. male presenting on 01/22/2021 for Follow-up   HPI  Pt had a negative covid 19 screening questionnaire.   Pt's niece is with him today.   He went to dermatology and had lesions removed from right temple and left arm.   Pt says his LE Swelling is better,  depends on the day.  His cafa application is incomplete; he has a paper in her car to go today to submit it   Pt says his groin Rash is gone  Pt unsure about his meds; he didn't bring with him.  He has no new complaints today.       Relevant past medical, surgical, family and social history reviewed and updated as indicated. Interim medical history since our last visit reviewed. Allergies and medications reviewed and updated.   Current Outpatient Medications:    albuterol (VENTOLIN HFA) 108 (90 Base) MCG/ACT inhaler, Inhale 2 puffs into the lungs every 6 (six) hours as needed for wheezing or shortness of breath., Disp: 3 each, Rfl: 0   atorvastatin (LIPITOR) 20 MG tablet, Take 1 tablet (20 mg total) by mouth daily., Disp: 90 tablet, Rfl: 0   fluticasone-salmeterol (ADVAIR HFA) 115-21 MCG/ACT inhaler, Inhale 2 puffs into the lungs 2 (two) times daily., Disp: 3 each, Rfl: 3   furosemide (LASIX) 20 MG tablet, Take 1 tablet (20 mg total) by mouth daily as needed for edema., Disp: 90 tablet, Rfl: 0   losartan (COZAAR) 100 MG tablet, Take 1 tablet (100 mg total) by mouth daily., Disp: 90 tablet, Rfl: 0   metFORMIN (GLUCOPHAGE) 500 MG tablet, Take 1 tablet (500 mg total) by mouth 2 (two) times daily with a meal., Disp: 180 tablet, Rfl: 0   miconazole (MICOTIN) 2 % cream, Apply 1 application topically 2 (two) times daily., Disp: 28.35 g, Rfl: 1   Mometasone Furo-Formoterol Fum (DULERA) 50-5  MCG/ACT AERO, Inhale 2 puffs into the lungs 2 (two) times daily., Disp: 1 each, Rfl: 0   potassium chloride (KLOR-CON) 10 MEQ tablet, Take 1/2 tablet po qd when taking furosemide, Disp: 90 tablet, Rfl: 0   albuterol (PROVENTIL) (2.5 MG/3ML) 0.083% nebulizer solution, Take 3 mLs (2.5 mg total) by nebulization every 6 (six) hours as needed for wheezing or shortness of breath. (Patient not taking: Reported on 01/22/2021), Disp: 75 mL, Rfl: 0    Review of Systems  Per HPI unless specifically indicated above     Objective:    BP (!) 155/92   Pulse 81   Temp 97.6 F (36.4 C)   Wt 245 lb (111.1 kg)   SpO2 95%   BMI 39.54 kg/m   Wt Readings from Last 3 Encounters:  01/22/21 245 lb (111.1 kg)  12/11/20 239 lb (108.4 kg)  11/13/20 240 lb (108.9 kg)    Physical Exam Vitals reviewed.  Constitutional:      General: He is not in acute distress.    Appearance: He is well-developed. He is not toxic-appearing.  HENT:     Head: Normocephalic and atraumatic.  Cardiovascular:     Rate and Rhythm: Normal rate and regular rhythm.  Pulmonary:  Effort: Pulmonary effort is normal.     Breath sounds: Normal breath sounds. No wheezing.  Abdominal:     General: Bowel sounds are normal.     Palpations: Abdomen is soft.     Tenderness: There is no abdominal tenderness.  Musculoskeletal:     Cervical back: Neck supple.     Right lower leg: Edema present.     Left lower leg: Edema present.     Comments: Trace BLE edema  Lymphadenopathy:     Cervical: No cervical adenopathy.  Skin:    General: Skin is warm and dry.     Comments: Groin rash resolved  Neurological:     Mental Status: He is alert and oriented to person, place, and time.  Psychiatric:        Behavior: Behavior is cooperative.          Assessment & Plan:    Encounter Diagnoses  Name Primary?   Primary hypertension Yes   Chronic obstructive pulmonary disease, unspecified COPD type ([place])    Tobacco use disorder     Hearing loss, unspecified hearing loss type, unspecified laterality    Uncontrolled type 2 diabetes mellitus with hyperglycemia ([facility])    Obesity, unspecified classification, unspecified obesity type, unspecified whether serious comorbidity present    History of colon polyps    Lipoma of left lower extremity       -Reviewed mri results with pt -encouraged Smoking cessation -discussed Weight management and encouraged healthy diet and regular exercise to reduce weight.  Discussed gradual increase in exercise as tolerated -pt to Get cafa submission completed -pt to call office when he gets home to review meds to see if he is taking what has been prescribed.  His bp is still not controlled but discussed importance of not adding more meds if he isn't taking something that he was prescribed.  Pt is reminded that he should bring his meds with him to every appointment -call came later in the afternoon and pt was still taking the losartan 50mg .  He was counseled to start taking the 100mg  as prescribed and he agreed (niece called and spoke with CMA) -pt still needs sleep study but needs to get his cafa in place -pt has triage appointment with GI  -pt to follow up 6 weeks.  He will need labs prior to that appointment.  He is to contact office sooner prn

## 2021-01-23 DIAGNOSIS — J449 Chronic obstructive pulmonary disease, unspecified: Secondary | ICD-10-CM | POA: Insufficient documentation

## 2021-01-23 DIAGNOSIS — F17200 Nicotine dependence, unspecified, uncomplicated: Secondary | ICD-10-CM | POA: Insufficient documentation

## 2021-01-23 DIAGNOSIS — I10 Essential (primary) hypertension: Secondary | ICD-10-CM | POA: Insufficient documentation

## 2021-01-23 DIAGNOSIS — E1165 Type 2 diabetes mellitus with hyperglycemia: Secondary | ICD-10-CM | POA: Insufficient documentation

## 2021-02-12 ENCOUNTER — Other Ambulatory Visit: Payer: Self-pay | Admitting: Physician Assistant

## 2021-02-14 ENCOUNTER — Telehealth: Payer: Self-pay

## 2021-02-14 NOTE — Telephone Encounter (Signed)
Call placed to dermatology to request records faxed to clinic.

## 2021-02-15 ENCOUNTER — Encounter: Payer: Self-pay | Admitting: Physician Assistant

## 2021-02-20 ENCOUNTER — Other Ambulatory Visit: Payer: Self-pay

## 2021-02-20 ENCOUNTER — Ambulatory Visit (INDEPENDENT_AMBULATORY_CARE_PROVIDER_SITE_OTHER): Payer: Self-pay | Admitting: *Deleted

## 2021-02-20 VITALS — Ht 66.0 in | Wt 243.6 lb

## 2021-02-20 DIAGNOSIS — Z1211 Encounter for screening for malignant neoplasm of colon: Secondary | ICD-10-CM

## 2021-02-20 MED ORDER — PEG 3350-KCL-NA BICARB-NACL 420 G PO SOLR: 4000.0000 mL | Freq: Once | ORAL | 0 refills | Status: AC

## 2021-02-20 NOTE — Progress Notes (Signed)
Gastroenterology Pre-Procedure Review  Request Date: 02/20/2021 Requesting Physician: Soyla Dryer, PA-C @ [facility], Last TCS 11/23/2002 by Dr. Gala Romney, inflammatory polyps, internal hemorrhoids  PATIENT REVIEW QUESTIONS: The patient responded to the following health history questions as indicated:    1. Diabetes Melitis: yes, type II  2. Joint replacements in the past 12 months: no 3. Major health problems in the past 3 months: no 4. Has an artificial valve or MVP: no 5. Has a defibrillator: no 6. Has been advised in past to take antibiotics in advance of a procedure like teeth cleaning: no 7. Family history of colon cancer: no  8. Alcohol Use: no 9. Illicit drug Use: no 10. History of sleep apnea: no  11. History of coronary artery or other vascular stents placed within the last 12 months: no 12. History of any prior anesthesia complications: no 13. Body mass index is 39.32 kg/m.    MEDICATIONS & ALLERGIES:    Patient reports the following regarding taking any blood thinners:   Plavix? no Aspirin? no Coumadin? no Brilinta? no Xarelto? no Eliquis? no Pradaxa? no Savaysa? no Effient? no  Patient confirms/reports the following medications:  Current Outpatient Medications  Medication Sig Dispense Refill   albuterol (PROVENTIL) (2.5 MG/3ML) 0.083% nebulizer solution Take 3 mLs (2.5 mg total) by nebulization every 6 (six) hours as needed for wheezing or shortness of breath. 75 mL 0   albuterol (VENTOLIN HFA) 108 (90 Base) MCG/ACT inhaler Inhale 2 puffs into the lungs every 6 (six) hours as needed for wheezing or shortness of breath. (Patient taking differently: Inhale 2 puffs into the lungs as needed for wheezing or shortness of breath.) 3 each 0   atorvastatin (LIPITOR) 20 MG tablet Take 1 tablet (20 mg total) by mouth daily. 90 tablet 0   fluticasone-salmeterol (ADVAIR HFA) 115-21 MCG/ACT inhaler Inhale 2 puffs into the lungs 2 (two) times daily. 3 each 3   furosemide (LASIX)  20 MG tablet Take 1 tablet (20 mg total) by mouth daily as needed for edema. 90 tablet 0   losartan (COZAAR) 100 MG tablet Take 1 tablet (100 mg total) by mouth daily. 90 tablet 0   metFORMIN (GLUCOPHAGE) 500 MG tablet Take 1 tablet (500 mg total) by mouth 2 (two) times daily with a meal. 180 tablet 0   Mometasone Furo-Formoterol Fum (DULERA) 50-5 MCG/ACT AERO Inhale 2 puffs into the lungs 2 (two) times daily. (Patient taking differently: Inhale 2 puffs into the lungs as needed.) 1 each 0   potassium chloride (KLOR-CON) 10 MEQ tablet Take 1/2 tablet po qd when taking furosemide 90 tablet 0   No current facility-administered medications for this visit.    Patient confirms/reports the following allergies:  No Known Allergies  No orders of the defined types were placed in this encounter.   AUTHORIZATION INFORMATION Primary Insurance: [employer] Pending  SCHEDULE INFORMATION: Procedure has been scheduled as follows:  Date: , Time:   Location:   This Gastroenterology Pre-Precedure Review Form is being routed to the following provider(s): Neil Crouch, PA-C

## 2021-02-20 NOTE — Progress Notes (Signed)
Pt made aware that I will call him once Dec procedure schedules have been released.

## 2021-02-21 ENCOUNTER — Other Ambulatory Visit: Payer: Self-pay | Admitting: Physician Assistant

## 2021-02-21 ENCOUNTER — Encounter: Payer: Self-pay | Admitting: Physician Assistant

## 2021-02-21 DIAGNOSIS — I10 Essential (primary) hypertension: Secondary | ICD-10-CM

## 2021-02-21 DIAGNOSIS — E1165 Type 2 diabetes mellitus with hyperglycemia: Secondary | ICD-10-CM

## 2021-02-21 DIAGNOSIS — E1169 Type 2 diabetes mellitus with other specified complication: Secondary | ICD-10-CM

## 2021-02-21 NOTE — Progress Notes (Signed)
Ok to schedule conscious sedation. ASA II. Day of prep: metformin AM dose only AM of TCS: hold metformin

## 2021-02-23 NOTE — Progress Notes (Signed)
Lmom for pt to call us back. 

## 2021-03-02 NOTE — Progress Notes (Signed)
Lmom for pt to call me back. 

## 2021-03-05 ENCOUNTER — Encounter: Payer: Self-pay | Admitting: Physician Assistant

## 2021-03-05 ENCOUNTER — Ambulatory Visit: Payer: Self-pay | Admitting: Physician Assistant

## 2021-03-05 ENCOUNTER — Encounter: Payer: Self-pay | Admitting: *Deleted

## 2021-03-05 VITALS — BP 136/85 | HR 83 | Temp 97.3°F | Wt 249.0 lb

## 2021-03-05 DIAGNOSIS — Z860100 Personal history of colon polyps, unspecified: Secondary | ICD-10-CM

## 2021-03-05 DIAGNOSIS — E785 Hyperlipidemia, unspecified: Secondary | ICD-10-CM

## 2021-03-05 DIAGNOSIS — F17200 Nicotine dependence, unspecified, uncomplicated: Secondary | ICD-10-CM

## 2021-03-05 DIAGNOSIS — E1169 Type 2 diabetes mellitus with other specified complication: Secondary | ICD-10-CM

## 2021-03-05 DIAGNOSIS — E1165 Type 2 diabetes mellitus with hyperglycemia: Secondary | ICD-10-CM

## 2021-03-05 DIAGNOSIS — J449 Chronic obstructive pulmonary disease, unspecified: Secondary | ICD-10-CM

## 2021-03-05 DIAGNOSIS — I10 Essential (primary) hypertension: Secondary | ICD-10-CM

## 2021-03-05 NOTE — Progress Notes (Signed)
Lmom for pt to call me back.  Mailed letter out to pt.

## 2021-03-05 NOTE — Patient Instructions (Addendum)
[facility]  SLEEP MAIN PHONE: [phone] SLEEP REFERRAL PHONE: [phone] SLEEP REFERRAL FAX: [phone]    Refer to dietician (she will call)   Exercise/increase activity   Get blood drawn/labs   Tito Dine- financial [phone]  [email or web address]

## 2021-03-05 NOTE — Progress Notes (Signed)
BP 136/85   Pulse 83   Temp (!) 97.3 F (36.3 C)   Wt 249 lb (112.9 kg)   SpO2 96%   BMI 40.19 kg/m    Subjective:    Patient ID: Thomas Johns, male    DOB: [birth year], 60 y.o.   MRN: [id number]  HPI: JONAN SEUFERT is a 60 y.o. male presenting on 03/05/2021 for Hypertension, COPD, and Diabetes   HPI   Chief Complaint  Patient presents with   Hypertension   COPD   Diabetes      Pt has talked with GI and a repeat colonscopy is planned (since he has history colon polyps)  Pt hasn't heard/gotten message from [facility].  Notes in epic says they have been unable to contact pt  Pt didn't get labs drawn  Pt didn't bring meds  When asked about his LE swelling, he says he doesn't know  He has an issue with cafa.  His Niece just sent email several days ago.  She had been talking with Diane about it.      Relevant past medical, surgical, family and social history reviewed and updated as indicated. Interim medical history since our last visit reviewed. Allergies and medications reviewed and updated.   Current Outpatient Medications:    albuterol (VENTOLIN HFA) 108 (90 Base) MCG/ACT inhaler, Inhale 2 puffs into the lungs every 6 (six) hours as needed for wheezing or shortness of breath., Disp: 3 each, Rfl: 0   atorvastatin (LIPITOR) 20 MG tablet, Take 1 tablet (20 mg total) by mouth daily., Disp: 90 tablet, Rfl: 0   fluticasone-salmeterol (ADVAIR HFA) 115-21 MCG/ACT inhaler, Inhale 2 puffs into the lungs 2 (two) times daily., Disp: 3 each, Rfl: 3   losartan (COZAAR) 100 MG tablet, Take 1 tablet (100 mg total) by mouth daily., Disp: 90 tablet, Rfl: 0   metFORMIN (GLUCOPHAGE) 500 MG tablet, Take 1 tablet (500 mg total) by mouth 2 (two) times daily with a meal., Disp: 180 tablet, Rfl: 0   potassium chloride (KLOR-CON) 10 MEQ tablet, Take 1/2 tablet po qd when taking furosemide, Disp: 90 tablet, Rfl: 0   albuterol (PROVENTIL) (2.5 MG/3ML) 0.083% nebulizer solution,  Take 3 mLs (2.5 mg total) by nebulization every 6 (six) hours as needed for wheezing or shortness of breath. (Patient not taking: Reported on 03/05/2021), Disp: 75 mL, Rfl: 0   furosemide (LASIX) 20 MG tablet, Take 1 tablet (20 mg total) by mouth daily as needed for edema. (Patient not taking: Reported on 03/05/2021), Disp: 90 tablet, Rfl: 0     Review of Systems  Per HPI unless specifically indicated above     Objective:    BP 136/85   Pulse 83   Temp (!) 97.3 F (36.3 C)   Wt 249 lb (112.9 kg)   SpO2 96%   BMI 40.19 kg/m   Wt Readings from Last 3 Encounters:  03/05/21 249 lb (112.9 kg)  02/20/21 243 lb 9.6 oz (110.5 kg)  01/22/21 245 lb (111.1 kg)    Physical Exam Vitals reviewed.  Constitutional:      General: He is not in acute distress.    Appearance: He is well-developed. He is obese. He is not ill-appearing.  HENT:     Head: Normocephalic and atraumatic.  Cardiovascular:     Rate and Rhythm: Normal rate and regular rhythm.  Pulmonary:     Effort: Pulmonary effort is normal.     Breath sounds: Normal breath sounds. No  wheezing.  Abdominal:     General: Bowel sounds are normal.     Palpations: Abdomen is soft.     Tenderness: There is no abdominal tenderness.  Musculoskeletal:     Cervical back: Neck supple.     Right lower leg: No edema.     Left lower leg: No edema.  Lymphadenopathy:     Cervical: No cervical adenopathy.  Skin:    General: Skin is warm and dry.  Neurological:     Mental Status: He is alert and oriented to person, place, and time.  Psychiatric:        Behavior: Behavior normal.         Assessment & Plan:    Encounter Diagnoses  Name Primary?   Uncontrolled type 2 diabetes mellitus with hyperglycemia ([place]) Yes   Primary hypertension    Chronic obstructive pulmonary disease, unspecified COPD type ([place])    Hyperlipidemia associated with type 2 diabetes mellitus ([place])    Tobacco use disorder    Morbid obesity ([place])    History of  colon polyps      -pt to get fasting labs drawn.  He will be called with  results -Will give Ingrid's contact information so pt can contact about cafa since Diane is out of office for extended. -Will refer to dietician to get help with weight.  Pt is encouraged to exercise regularly to help his weight as well as eat healthy DM diet (due to technical difficulties, referral will be placed after note is closed) -pt to contact [facility] about sleep study since [facility] has been unable to contact pt -pt is reminded that smoking cessation is encouraged -pt to follow up 3 months.  He is to contact office sooner prn

## 2021-03-07 ENCOUNTER — Other Ambulatory Visit: Payer: Self-pay | Admitting: Physician Assistant

## 2021-03-07 DIAGNOSIS — E1165 Type 2 diabetes mellitus with hyperglycemia: Secondary | ICD-10-CM

## 2021-03-08 ENCOUNTER — Other Ambulatory Visit: Payer: Self-pay | Admitting: Physician Assistant

## 2021-03-08 DIAGNOSIS — E1165 Type 2 diabetes mellitus with hyperglycemia: Secondary | ICD-10-CM

## 2021-03-19 ENCOUNTER — Encounter: Payer: Self-pay | Admitting: Physician Assistant

## 2021-03-27 ENCOUNTER — Telehealth: Payer: Self-pay | Admitting: Physician Assistant

## 2021-03-27 NOTE — Telephone Encounter (Signed)
Called pt's niece, [name].  Asked about pt still not having gotten labs drawn.  She says she will take him.  Also asked about referral to [facility] for sleep study.  She says she hadn't called; she was waiting for someone to call her.  I gave her the number and asked her to call to get it scheduled as they haven't been able to contact her (per notes in referral).  She says she will call.

## 2021-04-02 ENCOUNTER — Other Ambulatory Visit (HOSPITAL_COMMUNITY)
Admission: RE | Admit: 2021-04-02 | Discharge: 2021-04-02 | Disposition: A | Discharge status: Home / Self Care | Payer: Self-pay | Source: Ambulatory Visit | Attending: Physician Assistant | Admitting: Physician Assistant

## 2021-04-02 ENCOUNTER — Other Ambulatory Visit: Payer: Self-pay | Admitting: Physician Assistant

## 2021-04-02 DIAGNOSIS — E1165 Type 2 diabetes mellitus with hyperglycemia: Secondary | ICD-10-CM | POA: Insufficient documentation

## 2021-04-02 DIAGNOSIS — I10 Essential (primary) hypertension: Secondary | ICD-10-CM | POA: Insufficient documentation

## 2021-04-02 DIAGNOSIS — E785 Hyperlipidemia, unspecified: Secondary | ICD-10-CM | POA: Insufficient documentation

## 2021-04-02 DIAGNOSIS — E1169 Type 2 diabetes mellitus with other specified complication: Secondary | ICD-10-CM | POA: Insufficient documentation

## 2021-04-02 LAB — LIPID PANEL
Cholesterol: 179 mg/dL (ref 0–200)
HDL: 23 mg/dL — ABNORMAL LOW (ref >40)
LDL Cholesterol: 112 mg/dL — ABNORMAL HIGH (ref 0–99)
Total CHOL/HDL Ratio: 7.8 RATIO
Triglycerides: 219 mg/dL — ABNORMAL HIGH (ref <150)
VLDL: 44 mg/dL — ABNORMAL HIGH (ref 0–40)

## 2021-04-02 LAB — COMPREHENSIVE METABOLIC PANEL
ALT: 45 U/L — ABNORMAL HIGH (ref 0–44)
AST: 33 U/L (ref 15–41)
Albumin: 4 g/dL (ref 3.5–5.0)
Alkaline Phosphatase: 113 U/L (ref 38–126)
Anion gap: 9 (ref 5–15)
BUN: 12 mg/dL (ref 6–20)
CO2: 23 mmol/L (ref 22–32)
Calcium: 8.9 mg/dL (ref 8.9–10.3)
Chloride: 106 mmol/L (ref 98–111)
Creatinine, Ser: 0.85 mg/dL (ref 0.61–1.24)
GFR, Estimated: >60 mL/min (ref >60)
Glucose, Bld: 149 mg/dL — ABNORMAL HIGH (ref 70–99)
Potassium: 4.1 mmol/L (ref 3.5–5.1)
Sodium: 138 mmol/L (ref 135–145)
Total Bilirubin: 0.8 mg/dL (ref 0.3–1.2)
Total Protein: 6.9 g/dL (ref 6.5–8.1)

## 2021-04-03 LAB — MICROALBUMIN, URINE: Microalb, Ur: 15 ug/mL — ABNORMAL HIGH

## 2021-04-04 LAB — HEMOGLOBIN A1C
Hgb A1c MFr Bld: 8.7 % — ABNORMAL HIGH (ref 4.8–5.6)
Mean Plasma Glucose: 203 mg/dL

## 2021-04-09 ENCOUNTER — Other Ambulatory Visit: Payer: Self-pay | Admitting: Physician Assistant

## 2021-04-09 MED ORDER — METFORMIN HCL 1000 MG PO TABS: 1000.0000 mg | ORAL_TABLET | Freq: Two times a day (BID) | ORAL | 1 refills | Status: DC

## 2021-04-09 MED ORDER — ATORVASTATIN CALCIUM 40 MG PO TABS: 40.0000 mg | ORAL_TABLET | Freq: Every day | ORAL | 1 refills | Status: DC

## 2021-04-30 ENCOUNTER — Ambulatory Visit: Payer: Worker's Compensation | Admitting: Nutrition

## 2021-05-22 ENCOUNTER — Ambulatory Visit: Payer: Self-pay | Admitting: Physician Assistant

## 2021-05-23 ENCOUNTER — Other Ambulatory Visit: Payer: Self-pay | Admitting: Physician Assistant

## 2021-06-04 ENCOUNTER — Other Ambulatory Visit: Payer: Self-pay

## 2021-06-04 ENCOUNTER — Ambulatory Visit: Payer: Self-pay | Admitting: Physician Assistant

## 2021-06-04 ENCOUNTER — Telehealth: Payer: Self-pay

## 2021-06-04 ENCOUNTER — Encounter: Payer: Self-pay | Admitting: Physician Assistant

## 2021-06-04 VITALS — BP 132/73 | HR 93 | Temp 98.0°F | Wt 228.0 lb

## 2021-06-04 DIAGNOSIS — Z86010 Personal history of colonic polyps: Secondary | ICD-10-CM

## 2021-06-04 DIAGNOSIS — E1169 Type 2 diabetes mellitus with other specified complication: Secondary | ICD-10-CM

## 2021-06-04 DIAGNOSIS — E1165 Type 2 diabetes mellitus with hyperglycemia: Secondary | ICD-10-CM

## 2021-06-04 DIAGNOSIS — F489 Nonpsychotic mental disorder, unspecified: Secondary | ICD-10-CM

## 2021-06-04 DIAGNOSIS — J449 Chronic obstructive pulmonary disease, unspecified: Secondary | ICD-10-CM

## 2021-06-04 DIAGNOSIS — I10 Essential (primary) hypertension: Secondary | ICD-10-CM

## 2021-06-04 DIAGNOSIS — R0683 Snoring: Secondary | ICD-10-CM

## 2021-06-04 DIAGNOSIS — F17200 Nicotine dependence, unspecified, uncomplicated: Secondary | ICD-10-CM

## 2021-06-04 DIAGNOSIS — E669 Obesity, unspecified: Secondary | ICD-10-CM

## 2021-06-04 MED ORDER — LOSARTAN POTASSIUM 100 MG PO TABS: 100.0000 mg | ORAL_TABLET | Freq: Every day | ORAL | 0 refills | Status: DC

## 2021-06-04 MED ORDER — FLUTICASONE-SALMETEROL 100-50 MCG/ACT IN AEPB: 1.0000 | INHALATION_SPRAY | Freq: Two times a day (BID) | RESPIRATORY_TRACT | 0 refills | Status: DC

## 2021-06-04 NOTE — Patient Instructions (Addendum)
Call dietician to reschedule    -Phone: [phone]  Check on potassium prescription bottle   -GI -sleep study -circulation test (will discuss at next appt)          COVID-19 vaccination significantly lowers your risk of severe illness, hospitalization, and death if you get infected. Compared to people who are up to date with their COVID-19 vaccinations, unvaccinated people aremore likely to get COVID-19, much more likely to be hospitalized with COVID-19, and much more likely to die from COVID-19. Like all vaccines, COVID-19 vaccines are not 100% effective at preventing infection. Some people who are up to date with their COVID-19 vaccinations will get COVID-19 breakthrough infection. However, staying up to date with your COVID-19 vaccinations means that you are less likely to have a breakthrough infection and, if you do get sick, you are less likely to get severely ill or die. Staying up to date with COVID-19 vaccination also means you are less likely to spread the disease to others and increases your protection against new variants of SARS-CoV-2, the virus that causes COVID-19.

## 2021-06-04 NOTE — Telephone Encounter (Signed)
Called [employer] to confirm approval, informed approved 12/13/2020 & shipped 12/14/20, refill ordered.

## 2021-06-04 NOTE — Progress Notes (Signed)
BP 132/73    Pulse 93    Temp 98 F (36.7 C)    Wt 228 lb (103.4 kg)    SpO2 95%    BMI 36.80 kg/m    Subjective:    Patient ID: Thomas Johns, male    DOB: 06-25-1960, 61 y.o.   MRN: [id number]  HPI: Thomas Johns is a 61 y.o. male presenting on 06/04/2021 for Hypertension, COPD, and Diabetes   HPI   Chief Complaint  Patient presents with   Hypertension   COPD   Diabetes    Pt is 41yoM who presents for routine follow up.  Labs were not ordered to be done since it has only been 2 months since his last routine labs.  His niece [name] is with him today.   Pt has talked with GI and a repeat colonscopy is planned (since he has history colon polyps).  They are  waiting until they get [employer] financial assistance (cafa)   Pt hasn't heard/gotten message from [facility].  Notes in epic says they have been unable to contact pt.  Wendelyn Breslow says they are waiting until cafa.   When asked about LE swelling, he says he no, he isn't having any.   Dietician appointment was cancelled due to the RD was out of office.  It was not rescheduled (note in epic states unable to contact pt).  Pt has Not gotten the covid vaccination  Pt says he feels fearful at times and more so when he doesn't have his protection.  He never uses the word gun but nods when asked if that is what he is talking about.  Pt has no SI, HI but just feels like someone is going to hurt him.  Pt's niece says he is uncomfortable talking about his mental health because he is worried they will "put him in a straight jacket and carry him away".  Pt brought all of his meds in today (except for his K+ which he is confident he is taking).  He says he never got any advair.  He is using his albuterol MDI at least every other day and nebulizer several times each week.  He continues to smoke.   Pt reports that sometimes when he sits in one position, his feet feel tingly.  It readily resolves when he stands and walks around.      Relevant past medical, surgical, family and social history reviewed and updated as indicated. Interim medical history since our last visit reviewed. Allergies and medications reviewed and updated.    Current Outpatient Medications:    albuterol (PROVENTIL) (2.5 MG/3ML) 0.083% nebulizer solution, Take 3 mLs (2.5 mg total) by nebulization every 6 (six) hours as needed for wheezing or shortness of breath., Disp: 75 mL, Rfl: 0   albuterol (VENTOLIN HFA) 108 (90 Base) MCG/ACT inhaler, INHALE 2 PUFFS BY MOUTH EVERY 6 HOURS AS NEEDED FOR COUGHING, WHEEZING, OR SHORTNESS OF BREATH, Disp: 20.1 g, Rfl: 0   atorvastatin (LIPITOR) 40 MG tablet, Take 1 tablet (40 mg total) by mouth daily., Disp: 90 tablet, Rfl: 1   Cholecalciferol (D-3-5) 125 MCG (5000 UT) capsule, Take 5,000 Units by mouth daily., Disp: , Rfl:    fluticasone-salmeterol (ADVAIR DISKUS) 100-50 MCG/ACT AEPB, Inhale 1 puff into the lungs 2 (two) times daily., Disp: 1 each, Rfl: 0   furosemide (LASIX) 20 MG tablet, TAKE 1 Tablet BY MOUTH ONCE EVERY DAY AS NEEDED FOR EDEMA, Disp: 90 tablet, Rfl: 0  metFORMIN (GLUCOPHAGE) 1000 MG tablet, Take 1 tablet (1,000 mg total) by mouth 2 (two) times daily with a meal., Disp: 180 tablet, Rfl: 1   Multiple Vitamins-Minerals (CENTRUM SILVER 50+MEN) TABS, Take by mouth., Disp: , Rfl:    Omega-3 Fatty Acids (FISH OIL) 1200 MG CAPS, Take by mouth., Disp: , Rfl:    potassium chloride (KLOR-CON) 10 MEQ tablet, Take 1/2 tablet po qd when taking furosemide, Disp: 90 tablet, Rfl: 0   fluticasone-salmeterol (ADVAIR HFA) 115-21 MCG/ACT inhaler, Inhale 2 puffs into the lungs 2 (two) times daily. (Patient not taking: Reported on 06/04/2021), Disp: 3 each, Rfl: 3   losartan (COZAAR) 100 MG tablet, Take 1 tablet (100 mg total) by mouth daily., Disp: 90 tablet, Rfl: 0      Review of Systems  Per HPI unless specifically indicated above     Objective:    BP 132/73    Pulse 93    Temp 98 F (36.7 C)    Wt 228  lb (103.4 kg)    SpO2 95%    BMI 36.80 kg/m   Wt Readings from Last 3 Encounters:  06/04/21 228 lb (103.4 kg)  03/05/21 249 lb (112.9 kg)  02/20/21 243 lb 9.6 oz (110.5 kg)    Physical Exam Vitals reviewed.  Constitutional:      General: He is not in acute distress.    Appearance: He is well-developed. He is obese. He is not ill-appearing.  HENT:     Head: Normocephalic and atraumatic.  Cardiovascular:     Rate and Rhythm: Normal rate and regular rhythm.     Pulses:          Dorsalis pedis pulses are 1+ on the right side and 1+ on the left side.  Pulmonary:     Effort: Pulmonary effort is normal.     Breath sounds: Normal breath sounds. No wheezing.  Abdominal:     General: Bowel sounds are normal.     Palpations: Abdomen is soft.     Tenderness: There is no abdominal tenderness.  Musculoskeletal:     Cervical back: Neck supple.     Right lower leg: No edema.     Left lower leg: No edema.  Feet:     Right foot:     Skin integrity: No ulcer or skin breakdown.     Left foot:     Skin integrity: No ulcer or skin breakdown.     Comments: Pt is not wearing socks, just his sneakers.   Lymphadenopathy:     Cervical: No cervical adenopathy.  Skin:    General: Skin is warm and dry.  Neurological:     Mental Status: He is alert and oriented to person, place, and time.  Psychiatric:        Mood and Affect: Affect is not inappropriate.        Behavior: Behavior normal. Behavior is cooperative.          Assessment & Plan:   Encounter Diagnoses  Name Primary?   Uncontrolled type 2 diabetes mellitus with hyperglycemia ([place]) Yes   Primary hypertension    Chronic obstructive pulmonary disease, unspecified COPD type ([place])    Hyperlipidemia associated with type 2 diabetes mellitus ([place])    Tobacco use disorder    History of colon polyps    Obesity, unspecified classification, unspecified obesity type, unspecified whether serious comorbidity present    Snoring    Mental  health problem       -Contacted  care connect and no cafa is in place.  Pt and his niece were encouraged to contact [facility] to get assistance with completing the application. -Pt Needs ABI.  They want to wait until cafa.  Discussed with pt that he really needs to stop smoking to help his circulation -Gave wixela sample since hasn't gotten advair.  [facility]MA called pt assistance program and says that new shipment will be sent out now --pt is educated and encouraged to get covid vaccination -pt to cal and reschedule with dietician.  Telephone number is given -he is holding on sleep study, ABI, GI until he gets cafa -discussed with pt that carrying a gun is dangerous and can lead to violence that would otherwise not happen if he weren't carrying.   -pt is scheduled to see [facility] -pt to follow up 1 month with labs before appointment  (Spent 60 minutes with pt)

## 2021-06-12 ENCOUNTER — Ambulatory Visit: Payer: Self-pay | Admitting: Licensed Clinical Social Worker

## 2021-06-12 ENCOUNTER — Other Ambulatory Visit: Payer: Self-pay

## 2021-06-12 DIAGNOSIS — F419 Anxiety disorder, unspecified: Secondary | ICD-10-CM

## 2021-06-12 NOTE — Progress Notes (Signed)
[facility] engaged patient in initial [facility] appointment. [facility] provided active listening and validation as patient shared about symptoms, stressors, and stressful past events. [facility] led patient in breathing exercise.

## 2021-06-21 ENCOUNTER — Other Ambulatory Visit: Payer: Self-pay | Admitting: Physician Assistant

## 2021-06-21 DIAGNOSIS — E1165 Type 2 diabetes mellitus with hyperglycemia: Secondary | ICD-10-CM

## 2021-06-21 DIAGNOSIS — E785 Hyperlipidemia, unspecified: Secondary | ICD-10-CM

## 2021-06-21 DIAGNOSIS — E1169 Type 2 diabetes mellitus with other specified complication: Secondary | ICD-10-CM

## 2021-06-21 DIAGNOSIS — I10 Essential (primary) hypertension: Secondary | ICD-10-CM

## 2021-07-05 ENCOUNTER — Other Ambulatory Visit (HOSPITAL_COMMUNITY)
Admission: RE | Admit: 2021-07-05 | Discharge: 2021-07-05 | Disposition: A | Discharge status: Home / Self Care | Payer: Self-pay | Source: Ambulatory Visit | Attending: Physician Assistant | Admitting: Physician Assistant

## 2021-07-05 DIAGNOSIS — E1169 Type 2 diabetes mellitus with other specified complication: Secondary | ICD-10-CM | POA: Insufficient documentation

## 2021-07-05 DIAGNOSIS — I10 Essential (primary) hypertension: Secondary | ICD-10-CM | POA: Insufficient documentation

## 2021-07-05 DIAGNOSIS — E1165 Type 2 diabetes mellitus with hyperglycemia: Secondary | ICD-10-CM | POA: Insufficient documentation

## 2021-07-05 DIAGNOSIS — E785 Hyperlipidemia, unspecified: Secondary | ICD-10-CM | POA: Insufficient documentation

## 2021-07-05 LAB — COMPREHENSIVE METABOLIC PANEL
ALT: 26 U/L (ref 0–44)
AST: 16 U/L (ref 15–41)
Albumin: 4.1 g/dL (ref 3.5–5.0)
Alkaline Phosphatase: 101 U/L (ref 38–126)
Anion gap: 11 (ref 5–15)
BUN: 21 mg/dL — ABNORMAL HIGH (ref 6–20)
CO2: 22 mmol/L (ref 22–32)
Calcium: 8.7 mg/dL — ABNORMAL LOW (ref 8.9–10.3)
Chloride: 107 mmol/L (ref 98–111)
Creatinine, Ser: 0.85 mg/dL (ref 0.61–1.24)
GFR, Estimated: >60 mL/min (ref >60)
Glucose, Bld: 127 mg/dL — ABNORMAL HIGH (ref 70–99)
Potassium: 4.2 mmol/L (ref 3.5–5.1)
Sodium: 140 mmol/L (ref 135–145)
Total Bilirubin: 0.3 mg/dL (ref 0.3–1.2)
Total Protein: 7 g/dL (ref 6.5–8.1)

## 2021-07-05 LAB — LIPID PANEL
Cholesterol: 105 mg/dL (ref 0–200)
HDL: 21 mg/dL — ABNORMAL LOW (ref >40)
LDL Cholesterol: 63 mg/dL (ref 0–99)
Total CHOL/HDL Ratio: 5 RATIO
Triglycerides: 107 mg/dL (ref <150)
VLDL: 21 mg/dL (ref 0–40)

## 2021-07-06 LAB — HEMOGLOBIN A1C
Hgb A1c MFr Bld: 6.8 % — ABNORMAL HIGH (ref 4.8–5.6)
Mean Plasma Glucose: 148 mg/dL

## 2021-07-09 ENCOUNTER — Ambulatory Visit: Payer: Self-pay | Admitting: Physician Assistant

## 2021-07-09 ENCOUNTER — Encounter: Payer: Self-pay | Admitting: Physician Assistant

## 2021-07-09 VITALS — BP 125/74 | HR 78 | Temp 97.5°F | Wt 229.0 lb

## 2021-07-09 DIAGNOSIS — I10 Essential (primary) hypertension: Secondary | ICD-10-CM

## 2021-07-09 DIAGNOSIS — Z860100 Personal history of colon polyps, unspecified: Secondary | ICD-10-CM

## 2021-07-09 DIAGNOSIS — G8929 Other chronic pain: Secondary | ICD-10-CM

## 2021-07-09 DIAGNOSIS — E119 Type 2 diabetes mellitus without complications: Secondary | ICD-10-CM

## 2021-07-09 DIAGNOSIS — E1169 Type 2 diabetes mellitus with other specified complication: Secondary | ICD-10-CM

## 2021-07-09 DIAGNOSIS — J449 Chronic obstructive pulmonary disease, unspecified: Secondary | ICD-10-CM

## 2021-07-09 DIAGNOSIS — E669 Obesity, unspecified: Secondary | ICD-10-CM

## 2021-07-09 DIAGNOSIS — F17200 Nicotine dependence, unspecified, uncomplicated: Secondary | ICD-10-CM

## 2021-07-09 NOTE — Patient Instructions (Addendum)
[facility]- diabetic eye exam ? ? ?July 31, 2021 ?10:20am ?----------------------------- ? ?Back Exercises ?The following exercises strengthen the muscles that help to support the trunk (torso) and back. They also help to keep the lower back flexible. Doing these exercises can help to prevent or lessen existing low back pain. ?If you have back pain or discomfort, try doing these exercises 2-3 times each day or as told by your health care provider. ?As your pain improves, do them once each day, but increase the number of times that you repeat the steps for each exercise (do more repetitions). ?To prevent the recurrence of back pain, continue to do these exercises once each day or as told by your health care provider. ?Do exercises exactly as told by your health care provider and adjust them as directed. It is normal to feel mild stretching, pulling, tightness, or discomfort as you do these exercises, but you should stop right away if you feel sudden pain or your pain gets worse. ?Exercises ?Single knee to chest ?Repeat these steps 3-5 times for each leg: ?Lie on your back on a firm bed or the floor with your legs extended. ?Bring one knee to your chest. Your other leg should stay extended and in contact with the floor. ?Hold your knee in place by grabbing your knee or thigh with both hands and hold. ?Pull on your knee until you feel a gentle stretch in your lower back or buttocks. ?Hold the stretch for 10-30 seconds. ?Slowly release and straighten your leg. ? ?Pelvic tilt ?Repeat these steps 5-10 times: ?Lie on your back on a firm bed or the floor with your legs extended. ?Bend your knees so they are pointing toward the ceiling and your feet are flat on the floor. ?Tighten your lower abdominal muscles to press your lower back against the floor. This motion will tilt your pelvis so your tailbone points up toward the ceiling instead of pointing to your feet or the floor. ?With gentle tension and even  breathing, hold this position for 5-10 seconds. ? ?Cat-cow ?Repeat these steps until your lower back becomes more flexible: ?Get into a hands-and-knees position on a firm bed or the floor. Keep your hands under your shoulders, and keep your knees under your hips. You may place padding under your knees for comfort. ?Let your head hang down toward your chest. Contract your abdominal muscles and point your tailbone toward the floor so your lower back becomes rounded like the back of a cat. ?Hold this position for 5 seconds. ?Slowly lift your head, let your abdominal muscles relax, and point your tailbone up toward the ceiling so your back forms a sagging arch like the back of a cow. ?Hold this position for 5 seconds. ? ?Press-ups ?Repeat these steps 5-10 times: ?Lie on your abdomen (face-down) on a firm bed or the floor. ?Place your palms near your head, about shoulder-width apart. ?Keeping your back as relaxed as possible and keeping your hips on the floor, slowly straighten your arms to raise the top half of your body and lift your shoulders. Do not use your back muscles to raise your upper torso. You may adjust the placement of your hands to make yourself more comfortable. ?Hold this position for 5 seconds while you keep your back relaxed. ?Slowly return to lying flat on the floor. ? ?Bridges ?Repeat these steps 10 times: ?Lie on your back on a firm bed or the floor. ?Bend your knees so they are pointing toward  the ceiling and your feet are flat on the floor. Your arms should be flat at your sides, next to your body. ?Tighten your buttocks muscles and lift your buttocks off the floor until your waist is at almost the same height as your knees. You should feel the muscles working in your buttocks and the back of your thighs. If you do not feel these muscles, slide your feet 1-2 inches (2.5-5 cm) farther away from your buttocks. ?Hold this position for 3-5 seconds. ?Slowly lower your hips to the starting position, and  allow your buttocks muscles to relax completely. ?If this exercise is too easy, try doing it with your arms crossed over your chest. ?Abdominal crunches ?Repeat these steps 5-10 times: ?Lie on your back on a firm bed or the floor with your legs extended. ?Bend your knees so they are pointing toward the ceiling and your feet are flat on the floor. ?Cross your arms over your chest. ?Tip your chin slightly toward your chest without bending your neck. ?Tighten your abdominal muscles and slowly raise your torso high enough to lift your shoulder blades a tiny bit off the floor. Avoid raising your torso higher than that because it can put too much stress on your lower back and does not help to strengthen your abdominal muscles. ?Slowly return to your starting position. ? ?Back lifts ?Repeat these steps 5-10 times: ?Lie on your abdomen (face-down) with your arms at your sides, and rest your forehead on the floor. ?Tighten the muscles in your legs and your buttocks. ?Slowly lift your chest off the floor while you keep your hips pressed to the floor. Keep the back of your head in line with the curve in your back. Your eyes should be looking at the floor. ?Hold this position for 3-5 seconds. ?Slowly return to your starting position. ? ?Contact a health care provider if: ?Your back pain or discomfort gets much worse when you do an exercise. ?Your worsening back pain or discomfort does not lessen within 2 hours after you exercise. ?If you have any of these problems, stop doing these exercises right away. Do not do them again unless your health care provider says that you can. ?Get help right away if: ?You develop sudden, severe back pain. If this happens, stop doing the exercises right away. Do not do them again unless your health care provider says that you can. ?This information is not intended to replace advice given to you by your health care provider. Make sure you discuss any questions you have with your health care  provider. ?Document Revised: 10/03/2020 Document Reviewed: 06/21/2020 ?Elsevier Patient Education ? [place]. ? ?
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
## 2021-07-26 ENCOUNTER — Telehealth: Payer: Self-pay

## 2021-07-26 NOTE — Telephone Encounter (Signed)
Called to inform inhalers were delivered to [facility], left vm to call back. ?

## 2021-09-10 ENCOUNTER — Encounter: Payer: Self-pay | Admitting: Physician Assistant

## 2021-09-10 DIAGNOSIS — E11319 Type 2 diabetes mellitus with unspecified diabetic retinopathy without macular edema: Secondary | ICD-10-CM | POA: Insufficient documentation

## 2021-09-11 ENCOUNTER — Encounter: Payer: Self-pay | Admitting: Physician Assistant

## 2021-09-24 ENCOUNTER — Other Ambulatory Visit: Payer: Self-pay | Admitting: Physician Assistant

## 2021-09-24 DIAGNOSIS — I10 Essential (primary) hypertension: Secondary | ICD-10-CM

## 2021-09-24 DIAGNOSIS — E1169 Type 2 diabetes mellitus with other specified complication: Secondary | ICD-10-CM

## 2021-09-24 DIAGNOSIS — E119 Type 2 diabetes mellitus without complications: Secondary | ICD-10-CM

## 2021-10-04 ENCOUNTER — Other Ambulatory Visit (HOSPITAL_COMMUNITY)
Admission: RE | Admit: 2021-10-04 | Discharge: 2021-10-04 | Disposition: A | Discharge status: Home / Self Care | Payer: Self-pay | Source: Ambulatory Visit | Attending: Physician Assistant | Admitting: Physician Assistant

## 2021-10-04 DIAGNOSIS — E119 Type 2 diabetes mellitus without complications: Secondary | ICD-10-CM | POA: Insufficient documentation

## 2021-10-04 DIAGNOSIS — I10 Essential (primary) hypertension: Secondary | ICD-10-CM | POA: Insufficient documentation

## 2021-10-04 DIAGNOSIS — E785 Hyperlipidemia, unspecified: Secondary | ICD-10-CM | POA: Insufficient documentation

## 2021-10-04 DIAGNOSIS — E1169 Type 2 diabetes mellitus with other specified complication: Secondary | ICD-10-CM | POA: Insufficient documentation

## 2021-10-04 LAB — COMPREHENSIVE METABOLIC PANEL
ALT: 18 U/L (ref 0–44)
AST: 11 U/L — ABNORMAL LOW (ref 15–41)
Albumin: 4 g/dL (ref 3.5–5.0)
Alkaline Phosphatase: 91 U/L (ref 38–126)
Anion gap: 6 (ref 5–15)
BUN: 20 mg/dL (ref 6–20)
CO2: 23 mmol/L (ref 22–32)
Calcium: 8.7 mg/dL — ABNORMAL LOW (ref 8.9–10.3)
Chloride: 108 mmol/L (ref 98–111)
Creatinine, Ser: 1.01 mg/dL (ref 0.61–1.24)
GFR, Estimated: >60 mL/min (ref >60)
Glucose, Bld: 143 mg/dL — ABNORMAL HIGH (ref 70–99)
Potassium: 4.3 mmol/L (ref 3.5–5.1)
Sodium: 137 mmol/L (ref 135–145)
Total Bilirubin: 0.5 mg/dL (ref 0.3–1.2)
Total Protein: 7.1 g/dL (ref 6.5–8.1)

## 2021-10-04 LAB — LIPID PANEL
Cholesterol: 178 mg/dL (ref 0–200)
HDL: 23 mg/dL — ABNORMAL LOW (ref >40)
LDL Cholesterol: 121 mg/dL — ABNORMAL HIGH (ref 0–99)
Total CHOL/HDL Ratio: 7.7 RATIO
Triglycerides: 172 mg/dL — ABNORMAL HIGH (ref <150)
VLDL: 34 mg/dL (ref 0–40)

## 2021-10-04 LAB — BRAIN NATRIURETIC PEPTIDE: B Natriuretic Peptide: 42 pg/mL (ref 0.0–100.0)

## 2021-10-05 LAB — HEMOGLOBIN A1C
Hgb A1c MFr Bld: 6.7 % — ABNORMAL HIGH (ref 4.8–5.6)
Mean Plasma Glucose: 146 mg/dL

## 2021-10-08 ENCOUNTER — Encounter: Payer: Self-pay | Admitting: Physician Assistant

## 2021-10-08 ENCOUNTER — Other Ambulatory Visit: Payer: Self-pay | Admitting: Physician Assistant

## 2021-10-08 ENCOUNTER — Ambulatory Visit: Payer: Self-pay | Admitting: Physician Assistant

## 2021-10-08 VITALS — BP 136/80 | HR 81 | Temp 97.5°F

## 2021-10-08 DIAGNOSIS — F17200 Nicotine dependence, unspecified, uncomplicated: Secondary | ICD-10-CM

## 2021-10-08 DIAGNOSIS — E669 Obesity, unspecified: Secondary | ICD-10-CM

## 2021-10-08 DIAGNOSIS — E119 Type 2 diabetes mellitus without complications: Secondary | ICD-10-CM

## 2021-10-08 DIAGNOSIS — I10 Essential (primary) hypertension: Secondary | ICD-10-CM

## 2021-10-08 DIAGNOSIS — J449 Chronic obstructive pulmonary disease, unspecified: Secondary | ICD-10-CM

## 2021-10-08 DIAGNOSIS — E1169 Type 2 diabetes mellitus with other specified complication: Secondary | ICD-10-CM

## 2021-10-08 MED ORDER — FLUTICASONE-SALMETEROL 100-50 MCG/ACT IN AEPB: 1.0000 | INHALATION_SPRAY | Freq: Two times a day (BID) | RESPIRATORY_TRACT | Status: DC

## 2021-10-08 NOTE — Progress Notes (Unsigned)
BP 136/80   Pulse 81   Temp (!) 97.5 F (36.4 C)   SpO2 97%    Subjective:    Patient ID: Thomas Johns, male    DOB: [birth year], 61 y.o.   MRN: [id number]  HPI: Thomas Johns is a 61 y.o. male presenting on 10/08/2021 for No chief complaint on file.   HPI  Chief Complaint  Patient presents with   Diabetes   Hypertension   COPD   Hyperlipidemia    Pt is 4yoM who is in today for routine follow up.  He is accompanied by his niece.    He Didn't bring his meds with him to his appointment today.  He says he has Cut back smoking to 1-2 ppd, down from 2 1/2ppd.  He says his LE swelling is pretty good.  He says he has no complaints today.       Relevant past medical, surgical, family and social history reviewed and updated as indicated. Interim medical history since our last visit reviewed. Allergies and medications reviewed and updated.    Current Outpatient Medications:    albuterol (VENTOLIN HFA) 108 (90 Base) MCG/ACT inhaler, INHALE 2 PUFFS BY MOUTH EVERY 6 HOURS AS NEEDED FOR COUGHING, WHEEZING, OR SHORTNESS OF BREATH, Disp: 20.1 g, Rfl: 0   albuterol (PROVENTIL) (2.5 MG/3ML) 0.083% nebulizer solution, Take 3 mLs (2.5 mg total) by nebulization every 6 (six) hours as needed for wheezing or shortness of breath. (Patient not taking: Reported on 10/08/2021), Disp: 75 mL, Rfl: 0   atorvastatin (LIPITOR) 40 MG tablet, Take 1 tablet (40 mg total) by mouth daily. (Patient not taking: Reported on 10/08/2021), Disp: 90 tablet, Rfl: 1   Cholecalciferol (D-3-5) 125 MCG (5000 UT) capsule, Take 5,000 Units by mouth daily. (Patient not taking: Reported on 10/08/2021), Disp: , Rfl:    fluticasone-salmeterol (ADVAIR DISKUS) 100-50 MCG/ACT AEPB, Inhale 1 puff into the lungs 2 (two) times daily., Disp: 1 each, Rfl: 0   fluticasone-salmeterol (ADVAIR HFA) 115-21 MCG/ACT inhaler, Inhale 2 puffs into the lungs 2 (two) times daily., Disp: 3 each, Rfl: 3   furosemide (LASIX) 20 MG tablet, TAKE 1  Tablet BY MOUTH ONCE EVERY DAY AS NEEDED FOR EDEMA (Patient not taking: Reported on 10/08/2021), Disp: 90 tablet, Rfl: 0   losartan (COZAAR) 100 MG tablet, Take 1 tablet (100 mg total) by mouth daily. (Patient not taking: Reported on 10/08/2021), Disp: 90 tablet, Rfl: 0   metFORMIN (GLUCOPHAGE) 1000 MG tablet, Take 1 tablet (1,000 mg total) by mouth 2 (two) times daily with a meal. (Patient not taking: Reported on 10/08/2021), Disp: 180 tablet, Rfl: 1   Multiple Vitamins-Minerals (CENTRUM SILVER 50+MEN) TABS, Take by mouth. (Patient not taking: Reported on 10/08/2021), Disp: , Rfl:    Omega-3 Fatty Acids (FISH OIL) 1200 MG CAPS, Take by mouth. (Patient not taking: Reported on 10/08/2021), Disp: , Rfl:    potassium chloride (KLOR-CON) 10 MEQ tablet, Take 1/2 tablet po qd when taking furosemide (Patient not taking: Reported on 10/08/2021), Disp: 90 tablet, Rfl: 0     Review of Systems  Per HPI unless specifically indicated above     Objective:    BP 136/80   Pulse 81   Temp (!) 97.5 F (36.4 C)   SpO2 97%   Wt Readings from Last 3 Encounters:  07/09/21 229 lb (103.9 kg)  06/04/21 228 lb (103.4 kg)  03/05/21 249 lb (112.9 kg)    Physical Exam Vitals reviewed.  Constitutional:  General: He is not in acute distress.    Appearance: He is well-developed. He is obese. He is not toxic-appearing.  HENT:     Head: Normocephalic and atraumatic.  Cardiovascular:     Rate and Rhythm: Normal rate and regular rhythm.  Pulmonary:     Effort: Pulmonary effort is normal.     Breath sounds: Normal breath sounds. No wheezing.  Abdominal:     General: Bowel sounds are normal.     Palpations: Abdomen is soft.     Tenderness: There is no abdominal tenderness.  Musculoskeletal:     Cervical back: Neck supple.     Right lower leg: Edema present.     Left lower leg: Edema present.     Comments: Trace BLE edema  Lymphadenopathy:     Cervical: No cervical adenopathy.  Skin:    General: Skin is  warm and dry.  Neurological:     Mental Status: He is alert and oriented to person, place, and time.  Psychiatric:        Attention and Perception: Attention normal.        Behavior: Behavior normal. Behavior is cooperative.     Results for orders placed or performed during the hospital encounter of 10/04/21  B Nat Peptide  Result Value Ref Range   B Natriuretic Peptide 42.0 0.0 - 100.0 pg/mL  Hemoglobin A1c  Result Value Ref Range   Hgb A1c MFr Bld 6.7 (H) 4.8 - 5.6 %   Mean Plasma Glucose 146 mg/dL  Lipid panel  Result Value Ref Range   Cholesterol 178 0 - 200 mg/dL   Triglycerides 172 (H) <150 mg/dL   HDL 23 (L) >40 mg/dL   Total CHOL/HDL Ratio 7.7 RATIO   VLDL 34 0 - 40 mg/dL   LDL Cholesterol 121 (H) 0 - 99 mg/dL  Comprehensive metabolic panel  Result Value Ref Range   Sodium 137 135 - 145 mmol/L   Potassium 4.3 3.5 - 5.1 mmol/L   Chloride 108 98 - 111 mmol/L   CO2 23 22 - 32 mmol/L   Glucose, Bld 143 (H) 70 - 99 mg/dL   BUN 20 6 - 20 mg/dL   Creatinine, Ser 1.01 0.61 - 1.24 mg/dL   Calcium 8.7 (L) 8.9 - 10.3 mg/dL   Total Protein 7.1 6.5 - 8.1 g/dL   Albumin 4.0 3.5 - 5.0 g/dL   AST 11 (L) 15 - 41 U/L   ALT 18 0 - 44 U/L   Alkaline Phosphatase 91 38 - 126 U/L   Total Bilirubin 0.5 0.3 - 1.2 mg/dL   GFR, Estimated >60 >60 mL/min   Anion gap 6 5 - 15      Assessment & Plan:    Encounter Diagnoses  Name Primary?   Controlled type 2 diabetes mellitus without complication, without long-term current use of insulin ([place]) Yes   Primary hypertension    Hyperlipidemia associated with type 2 diabetes mellitus ([facility])    Chronic obstructive pulmonary disease, unspecified COPD type ([place])    Tobacco use disorder    Obesity, unspecified classification, unspecified obesity type, unspecified whether serious comorbidity present      -reviewed labs with pt -encouraged pt and his niece to go through his medications to make sure he has everything.  No changes today.   -pt  was encouraged to continue to decrease his smoking as much as possible.  -pt to follow up 3 months.  He is to contact office sooner prn

## 2021-11-21 ENCOUNTER — Ambulatory Visit: Payer: Self-pay | Admitting: Physician Assistant

## 2021-11-21 ENCOUNTER — Encounter: Payer: Self-pay | Admitting: Physician Assistant

## 2021-11-21 VITALS — BP 100/60 | HR 97 | Temp 97.8°F | Wt 204.0 lb

## 2021-11-21 DIAGNOSIS — E119 Type 2 diabetes mellitus without complications: Secondary | ICD-10-CM

## 2021-11-21 DIAGNOSIS — R197 Diarrhea, unspecified: Secondary | ICD-10-CM

## 2021-11-21 LAB — GLUCOSE, POCT (MANUAL RESULT ENTRY): POC Glucose: 95 mg/dl (ref 70–99)

## 2021-11-21 MED ORDER — ATORVASTATIN CALCIUM 40 MG PO TABS: ORAL_TABLET | ORAL | 1 refills | Status: DC

## 2021-11-21 MED ORDER — METFORMIN HCL 1000 MG PO TABS
ORAL_TABLET | ORAL | 1 refills | Status: DC
Start: 2021-11-21 — End: 2022-05-02

## 2021-11-21 NOTE — Patient Instructions (Signed)
Diarrhea, Adult Diarrhea is frequent loose and watery bowel movements. Diarrhea can make you feel weak and cause you to become dehydrated. Dehydration can make you tired and thirsty, cause you to have a dry mouth, and decrease how often you urinate. Diarrhea typically lasts 2-3 days. However, it can last longer if it is a sign of something more serious. It is important to treat your diarrhea as told by your health care provider. Follow these instructions at home: Eating and drinking     Follow these recommendations as told by your health care provider: Take an oral rehydration solution (ORS). This is an over-the-counter medicine that helps return your body to its normal balance of nutrients and water. It is found at pharmacies and retail stores. Drink plenty of fluids, such as water, ice chips, diluted fruit juice, and low-calorie sports drinks. You can drink milk also, if desired. Avoid drinking fluids that contain a lot of sugar or caffeine, such as energy drinks, sports drinks, and soda. Eat bland, easy-to-digest foods in small amounts as you are able. These foods include bananas, applesauce, rice, lean meats, toast, and crackers. Avoid alcohol. Avoid spicy or fatty foods.  Medicines Take over-the-counter and prescription medicines only as told by your health care provider. If you were prescribed an antibiotic medicine, take it as told by your health care provider. Do not stop using the antibiotic even if you start to feel better. General instructions  Wash your hands often using soap and water. If soap and water are not available, use a hand sanitizer. Others in the household should wash their hands as well. Hands should be washed: After using the toilet or changing a diaper. Before preparing, cooking, or serving food. While caring for a sick person or while visiting someone in a hospital. Drink enough fluid to keep your urine pale yellow. Rest at home while you recover. Watch your  condition for any changes. Take a warm bath to relieve any burning or pain from frequent diarrhea episodes. Keep all follow-up visits as told by your health care provider. This is important. Contact a health care provider if: You have a fever. Your diarrhea gets worse. You have new symptoms. You cannot keep fluids down. You feel light-headed or dizzy. You have a headache. You have muscle cramps. Get help right away if: You have chest pain. You feel extremely weak or you faint. You have bloody or black stools or stools that look like tar. You have severe pain, cramping, or bloating in your abdomen. You have trouble breathing or you are breathing very quickly. Your heart is beating very quickly. Your skin feels cold and clammy. You feel confused. You have signs of dehydration, such as: Dark urine, very little urine, or no urine. Cracked lips. Dry mouth. Sunken eyes. Sleepiness. Weakness. Summary Diarrhea is frequent loose and sometimes watery bowel movements. Diarrhea can make you feel weak and cause you to become dehydrated. Drink enough fluids to keep your urine pale yellow. Make sure that you wash your hands after using the toilet. If soap and water are not available, use hand sanitizer. Contact a health care provider if your diarrhea gets worse or you have new symptoms. Get help right away if you have signs of dehydration. This information is not intended to replace advice given to you by your health care provider. Make sure you discuss any questions you have with your health care provider. Document Revised: 10/18/2020 Document Reviewed: 10/18/2020 Elsevier Patient Education  [place].

## 2021-11-21 NOTE — Progress Notes (Signed)
BP 100/60   Pulse 97   Temp 97.8 F (36.6 C)   Wt 204 lb (92.5 kg)   SpO2 99%   BMI 32.93 kg/m    Subjective:    Patient ID: Thomas Johns, male    DOB: Nov 13, 1960, 61 y.o.   MRN: [id number]  HPI: Thomas Johns is a 61 y.o. male presenting on 11/21/2021 for Diarrhea (Stool has been runny at times for the past 2-3 weeks. Pt has not taken anything for diarrhea. Pt's last runny stool episode was yesterday. )   HPI  Chief Complaint  Patient presents with   Diarrhea    Stool has been runny at times for the past 2-3 weeks. Pt has not taken anything for diarrhea. Pt's last runny stool episode was yesterday.     No blood in diarrhea.   Last solid BM was about 2 1/2 wk ago.  No appetite.     He is on city water.  No drinking creeks/lakes.  No travel.  No antibiotics.    No abd pain.  No urine problems.       Relevant past medical, surgical, family and social history reviewed and updated as indicated. Interim medical history since our last visit reviewed. Allergies and medications reviewed and updated.   Current Outpatient Medications:    albuterol (PROVENTIL) (2.5 MG/3ML) 0.083% nebulizer solution, Take 3 mLs (2.5 mg total) by nebulization every 6 (six) hours as needed for wheezing or shortness of breath., Disp: 75 mL, Rfl: 0   albuterol (VENTOLIN HFA) 108 (90 Base) MCG/ACT inhaler, INHALE 2 PUFFS BY MOUTH EVERY 6 HOURS AS NEEDED FOR COUGHING, WHEEZING, OR SHORTNESS OF BREATH, Disp: 20.1 g, Rfl: 0   fluticasone-salmeterol ([place]IXEL[place] INHUB) 100-50 MCG/ACT AEPB, Inhale 1 puff into the lungs 2 (two) times daily., Disp: , Rfl:    furosemide (LASIX) 20 MG tablet, TAKE 1 Tablet BY MOUTH ONCE EVERY DAY AS NEEDED FOR EDEMA, Disp: 90 tablet, Rfl: 0   losartan (COZAAR) 100 MG tablet, TAKE 1 Tablet BY MOUTH ONCE EVERY DAY, Disp: 90 tablet, Rfl: 0   potassium chloride (KLOR-CON) 10 MEQ tablet, Take 1/2 tablet po qd when taking furosemide, Disp: 90 tablet, Rfl: 0   atorvastatin (LIPITOR) 40  MG tablet, TAKE 1 Tablet BY MOUTH ONCE DAILy, Disp: 90 tablet, Rfl: 1   Cholecalciferol (D-3-5) 125 MCG (5000 UT) capsule, Take 5,000 Units by mouth daily. (Patient not taking: Reported on 10/08/2021), Disp: , Rfl:    metFORMIN (GLUCOPHAGE) 1000 MG tablet, TAKE 1 Tablet  BY MOUTH TWICE DAILY WITH A MEAL, Disp: 180 tablet, Rfl: 1   Multiple Vitamins-Minerals (CENTRUM SILVER 50+MEN) TABS, Take by mouth. (Patient not taking: Reported on 10/08/2021), Disp: , Rfl:    Omega-3 Fatty Acids (FISH OIL) 1200 MG CAPS, Take by mouth. (Patient not taking: Reported on 10/08/2021), Disp: , Rfl:    Review of Systems  Per HPI unless specifically indicated above     Objective:    BP 100/60   Pulse 97   Temp 97.8 F (36.6 C)   Wt 204 lb (92.5 kg)   SpO2 99%   BMI 32.93 kg/m   Wt Readings from Last 3 Encounters:  11/21/21 204 lb (92.5 kg)  07/09/21 229 lb (103.9 kg)  06/04/21 228 lb (103.4 kg)    Physical Exam Vitals reviewed.  Constitutional:      General: He is not in acute distress.    Appearance: He is well-developed. He is not ill-appearing.  HENT:  Head: Normocephalic and atraumatic.  Cardiovascular:     Rate and Rhythm: Normal rate and regular rhythm.  Pulmonary:     Effort: Pulmonary effort is normal.     Breath sounds: Normal breath sounds. No wheezing.  Abdominal:     General: Bowel sounds are normal.     Palpations: Abdomen is soft.     Tenderness: There is no abdominal tenderness.  Musculoskeletal:     Cervical back: Neck supple.     Right lower leg: No edema.     Left lower leg: No edema.  Lymphadenopathy:     Cervical: No cervical adenopathy.  Skin:    General: Skin is warm and dry.  Neurological:     Mental Status: He is alert and oriented to person, place, and time.  Psychiatric:        Behavior: Behavior normal.           Assessment & Plan:   Encounter Diagnoses  Name Primary?   Diarrhea, unspecified type Yes   Controlled type 2 diabetes mellitus without  complication, without long-term current use of insulin ([place])      -ordered stool studies -pt educated on maintaining hydration and is given reading information

## 2021-12-27 ENCOUNTER — Other Ambulatory Visit: Payer: Self-pay | Admitting: Physician Assistant

## 2021-12-27 DIAGNOSIS — E1169 Type 2 diabetes mellitus with other specified complication: Secondary | ICD-10-CM

## 2021-12-27 DIAGNOSIS — E119 Type 2 diabetes mellitus without complications: Secondary | ICD-10-CM

## 2021-12-27 DIAGNOSIS — Z125 Encounter for screening for malignant neoplasm of prostate: Secondary | ICD-10-CM

## 2021-12-27 DIAGNOSIS — I10 Essential (primary) hypertension: Secondary | ICD-10-CM

## 2022-01-03 ENCOUNTER — Other Ambulatory Visit (HOSPITAL_COMMUNITY)
Admission: RE | Admit: 2022-01-03 | Discharge: 2022-01-03 | Disposition: A | Discharge status: Home / Self Care | Payer: Self-pay | Source: Ambulatory Visit | Attending: Physician Assistant | Admitting: Physician Assistant

## 2022-01-03 DIAGNOSIS — E785 Hyperlipidemia, unspecified: Secondary | ICD-10-CM | POA: Insufficient documentation

## 2022-01-03 DIAGNOSIS — Z125 Encounter for screening for malignant neoplasm of prostate: Secondary | ICD-10-CM | POA: Insufficient documentation

## 2022-01-03 DIAGNOSIS — E1169 Type 2 diabetes mellitus with other specified complication: Secondary | ICD-10-CM | POA: Insufficient documentation

## 2022-01-03 DIAGNOSIS — I10 Essential (primary) hypertension: Secondary | ICD-10-CM | POA: Insufficient documentation

## 2022-01-03 DIAGNOSIS — E119 Type 2 diabetes mellitus without complications: Secondary | ICD-10-CM | POA: Insufficient documentation

## 2022-01-03 LAB — COMPREHENSIVE METABOLIC PANEL
ALT: 12 U/L (ref 0–44)
AST: 10 U/L — ABNORMAL LOW (ref 15–41)
Albumin: 4.1 g/dL (ref 3.5–5.0)
Alkaline Phosphatase: 96 U/L (ref 38–126)
Anion gap: 5 (ref 5–15)
BUN: 12 mg/dL (ref 6–20)
CO2: 23 mmol/L (ref 22–32)
Calcium: 8.8 mg/dL — ABNORMAL LOW (ref 8.9–10.3)
Chloride: 108 mmol/L (ref 98–111)
Creatinine, Ser: 0.72 mg/dL (ref 0.61–1.24)
GFR, Estimated: >60 mL/min (ref >60)
Glucose, Bld: 120 mg/dL — ABNORMAL HIGH (ref 70–99)
Potassium: 4.1 mmol/L (ref 3.5–5.1)
Sodium: 136 mmol/L (ref 135–145)
Total Bilirubin: 0.8 mg/dL (ref 0.3–1.2)
Total Protein: 7.1 g/dL (ref 6.5–8.1)

## 2022-01-03 LAB — PSA: Prostatic Specific Antigen: 0.35 ng/mL (ref 0.00–4.00)

## 2022-01-03 LAB — LIPID PANEL
Cholesterol: 94 mg/dL (ref 0–200)
HDL: 21 mg/dL — ABNORMAL LOW (ref >40)
LDL Cholesterol: 59 mg/dL (ref 0–99)
Total CHOL/HDL Ratio: 4.5 RATIO
Triglycerides: 72 mg/dL (ref <150)
VLDL: 14 mg/dL (ref 0–40)

## 2022-01-04 ENCOUNTER — Other Ambulatory Visit (HOSPITAL_COMMUNITY)
Admission: RE | Admit: 2022-01-04 | Discharge: 2022-01-04 | Disposition: A | Discharge status: Home / Self Care | Payer: Self-pay | Source: Ambulatory Visit | Attending: Physician Assistant | Admitting: Physician Assistant

## 2022-01-04 DIAGNOSIS — R197 Diarrhea, unspecified: Secondary | ICD-10-CM | POA: Insufficient documentation

## 2022-01-04 LAB — HEMOGLOBIN A1C
Hgb A1c MFr Bld: 6 % — ABNORMAL HIGH (ref 4.8–5.6)
Mean Plasma Glucose: 126 mg/dL

## 2022-01-04 LAB — C DIFFICILE QUICK SCREEN W PCR REFLEX
C Diff antigen: NEGATIVE
C Diff interpretation: NOT DETECTED
C Diff toxin: NEGATIVE

## 2022-01-05 LAB — GASTROINTESTINAL PANEL BY PCR, STOOL (REPLACES STOOL CULTURE)

## 2022-01-07 ENCOUNTER — Ambulatory Visit: Payer: Self-pay | Admitting: Physician Assistant

## 2022-01-07 ENCOUNTER — Encounter: Payer: Self-pay | Admitting: Physician Assistant

## 2022-01-07 VITALS — BP 147/86 | HR 86 | Temp 98.7°F | Ht 66.0 in | Wt 198.5 lb

## 2022-01-07 DIAGNOSIS — F17200 Nicotine dependence, unspecified, uncomplicated: Secondary | ICD-10-CM

## 2022-01-07 DIAGNOSIS — E119 Type 2 diabetes mellitus without complications: Secondary | ICD-10-CM

## 2022-01-07 DIAGNOSIS — Z86010 Personal history of colonic polyps: Secondary | ICD-10-CM

## 2022-01-07 DIAGNOSIS — R634 Abnormal weight loss: Secondary | ICD-10-CM

## 2022-01-07 DIAGNOSIS — I10 Essential (primary) hypertension: Secondary | ICD-10-CM

## 2022-01-07 DIAGNOSIS — E1169 Type 2 diabetes mellitus with other specified complication: Secondary | ICD-10-CM

## 2022-01-07 DIAGNOSIS — J449 Chronic obstructive pulmonary disease, unspecified: Secondary | ICD-10-CM

## 2022-01-07 NOTE — Progress Notes (Unsigned)
BP (!) 147/86   Pulse 86   Temp 98.7 F (37.1 C)   Ht '5\' 6"'$  (1.676 m)   Wt 198 lb 8 oz (90 kg)   SpO2 98%   BMI 32.04 kg/m    Subjective:    Patient ID: Thomas Johns, male    DOB: 27-Sep-1960, 61 y.o.   MRN: [id number]  HPI: Thomas Johns is a 61 y.o. male presenting on 01/07/2022 for COPD, Diabetes, Hypertension, and Hyperlipidemia   HPI    Chief Complaint  Patient presents with   COPD   Diabetes   Hypertension   Hyperlipidemia    Pt's niece [name] is with him today.  He only has diarrhea now once every 2 weeks and he thinks it depends on what he eats.  He has started avoid fried foods because he says those bother his stomach.   Also, He is watching what he eats and is trying to eat less.  He has abd pain less than 2 times/month.   He says fried food bothers his stomach.    His Niece who helps care for pt is concerned about his weight loss-   about 50 pounds since November.   He says he is Breathing allright.  He is still smoking .   He has advair hfa and wixela diskus- he uses neither daily and occasionally uses one or the other.  He had been scheduled for colonoscopy but it never got done.       Relevant past medical, surgical, family and social history reviewed and updated as indicated. Interim medical history since our last visit reviewed. Allergies and medications reviewed and updated.   Current Outpatient Medications:    albuterol (PROVENTIL) (2.5 MG/3ML) 0.083% nebulizer solution, Take 3 mLs (2.5 mg total) by nebulization every 6 (six) hours as needed for wheezing or shortness of breath., Disp: 75 mL, Rfl: 0   albuterol (VENTOLIN HFA) 108 (90 Base) MCG/ACT inhaler, INHALE 2 PUFFS BY MOUTH EVERY 6 HOURS AS NEEDED FOR COUGHING, WHEEZING, OR SHORTNESS OF BREATH, Disp: 20.1 g, Rfl: 0   atorvastatin (LIPITOR) 40 MG tablet, TAKE 1 Tablet BY MOUTH ONCE DAILy, Disp: 90 tablet, Rfl: 1   Cholecalciferol (D-3-5) 125 MCG (5000 UT) capsule, Take 5,000 Units by  mouth daily., Disp: , Rfl:    fluticasone-salmeterol (WIXELA INHUB) 100-50 MCG/ACT AEPB, Inhale 1 puff into the lungs 2 (two) times daily., Disp: , Rfl:    furosemide (LASIX) 20 MG tablet, TAKE 1 Tablet BY MOUTH ONCE EVERY DAY AS NEEDED FOR EDEMA, Disp: 90 tablet, Rfl: 0   losartan (COZAAR) 100 MG tablet, TAKE 1 Tablet BY MOUTH ONCE EVERY DAY, Disp: 90 tablet, Rfl: 0   metFORMIN (GLUCOPHAGE) 1000 MG tablet, TAKE 1 Tablet  BY MOUTH TWICE DAILY WITH A MEAL, Disp: 180 tablet, Rfl: 1   Omega-3 Fatty Acids (FISH OIL) 1200 MG CAPS, Take by mouth., Disp: , Rfl:    Multiple Vitamins-Minerals (CENTRUM SILVER 50+MEN) TABS, Take by mouth. (Patient not taking: Reported on 10/08/2021), Disp: , Rfl:    potassium chloride (KLOR-CON) 10 MEQ tablet, Take 1/2 tablet po qd when taking furosemide (Patient not taking: Reported on 01/07/2022), Disp: 90 tablet, Rfl: 0    Review of Systems  Per HPI unless specifically indicated above     Objective:    BP (!) 147/86   Pulse 86   Temp 98.7 F (37.1 C)   Ht '5\' 6"'$  (1.676 m)   Wt 198 lb 8 oz (90  kg)   SpO2 98%   BMI 32.04 kg/m   Wt Readings from Last 3 Encounters:  01/07/22 198 lb 8 oz (90 kg)  11/21/21 204 lb (92.5 kg)  07/09/21 229 lb (103.9 kg)    Physical Exam Vitals reviewed.  Constitutional:      General: He is not in acute distress.    Appearance: He is well-developed. He is not ill-appearing.  HENT:     Head: Normocephalic and atraumatic.  Cardiovascular:     Rate and Rhythm: Normal rate and regular rhythm.  Pulmonary:     Effort: Pulmonary effort is normal.     Breath sounds: Normal breath sounds. No wheezing.  Abdominal:     General: Bowel sounds are normal.     Palpations: Abdomen is soft.     Tenderness: There is no abdominal tenderness.  Musculoskeletal:     Cervical back: Neck supple.     Right lower leg: No edema.     Left lower leg: No edema.  Lymphadenopathy:     Cervical: No cervical adenopathy.  Skin:    General: Skin is  warm and dry.  Neurological:     Mental Status: He is alert and oriented to person, place, and time.  Psychiatric:        Behavior: Behavior normal.     Results for orders placed or performed during the hospital encounter of 01/04/22  Gastrointestinal Panel by PCR , Stool   Specimen: Stool  Result Value Ref Range   Campylobacter species NOT DETECTED NOT DETECTED   Plesimonas shigelloides NOT DETECTED NOT DETECTED   Salmonella species NOT DETECTED NOT DETECTED   Yersinia enterocolitica NOT DETECTED NOT DETECTED   Vibrio species NOT DETECTED NOT DETECTED   Vibrio cholerae NOT DETECTED NOT DETECTED   Enteroaggregative E coli (EAEC) NOT DETECTED NOT DETECTED   Enteropathogenic E coli (EPEC) NOT DETECTED NOT DETECTED   Enterotoxigenic E coli (ETEC) NOT DETECTED NOT DETECTED   Shiga like toxin producing E coli (STEC) NOT DETECTED NOT DETECTED   Shigella/Enteroinvasive E coli (EIEC) NOT DETECTED NOT DETECTED   Cryptosporidium NOT DETECTED NOT DETECTED   Cyclospora cayetanensis NOT DETECTED NOT DETECTED   Entamoeba histolytica NOT DETECTED NOT DETECTED   Giardia lamblia NOT DETECTED NOT DETECTED   Adenovirus F40/41 NOT DETECTED NOT DETECTED   Astrovirus NOT DETECTED NOT DETECTED   Norovirus GI/GII NOT DETECTED NOT DETECTED   Rotavirus A NOT DETECTED NOT DETECTED   Sapovirus (I, II, IV, and V) NOT DETECTED NOT DETECTED  C Difficile Quick Screen w PCR reflex   Specimen: STOOL  Result Value Ref Range   C Diff antigen NEGATIVE NEGATIVE   C Diff toxin NEGATIVE NEGATIVE   C Diff interpretation No C. difficile detected.       Assessment & Plan:    Encounter Diagnoses  Name Primary?   Controlled type 2 diabetes mellitus without complication, without long-term current use of insulin ([place]) Yes   Primary hypertension    Hyperlipidemia associated with type 2 diabetes mellitus ([place])    Chronic obstructive pulmonary disease, unspecified COPD type ([place])    Tobacco use disorder    Weight  loss    History of colon polyps      -reviewed labs with pt -will check on cafa and let pt & niece know if active -pt to call to r/s Colonoscopy -discussed that weight loss most likely due to improvements in food choices and smaller portions -but will order CT in light of  weight loss and heavy smoking (currently 2 ppd) -pt educated to Use maintenance inhaler every day and rescue inhaler prn -pt is encouraged to stop smoking or at least reduce quantity smoked -pt to Continue current rx -pt to Update enrollment -pt to follow up 3 months.  He is to contact office sooner prn

## 2022-01-15 ENCOUNTER — Other Ambulatory Visit: Payer: Self-pay | Admitting: Physician Assistant

## 2022-01-21 ENCOUNTER — Other Ambulatory Visit: Payer: Self-pay | Admitting: Physician Assistant

## 2022-01-24 ENCOUNTER — Ambulatory Visit (HOSPITAL_COMMUNITY): Payer: Self-pay

## 2022-03-28 ENCOUNTER — Other Ambulatory Visit: Payer: Self-pay | Admitting: Physician Assistant

## 2022-03-28 DIAGNOSIS — I10 Essential (primary) hypertension: Secondary | ICD-10-CM

## 2022-03-28 DIAGNOSIS — E119 Type 2 diabetes mellitus without complications: Secondary | ICD-10-CM

## 2022-03-28 DIAGNOSIS — E1169 Type 2 diabetes mellitus with other specified complication: Secondary | ICD-10-CM

## 2022-04-01 ENCOUNTER — Ambulatory Visit: Payer: Self-pay | Admitting: Physician Assistant

## 2022-04-01 ENCOUNTER — Telehealth: Payer: Self-pay

## 2022-04-08 ENCOUNTER — Ambulatory Visit: Payer: Self-pay | Admitting: Physician Assistant

## 2022-04-08 ENCOUNTER — Encounter: Payer: Self-pay | Admitting: Physician Assistant

## 2022-04-08 VITALS — BP 127/73 | HR 92 | Temp 97.8°F | Wt 201.0 lb

## 2022-04-08 DIAGNOSIS — I10 Essential (primary) hypertension: Secondary | ICD-10-CM

## 2022-04-08 DIAGNOSIS — S41102A Unspecified open wound of left upper arm, initial encounter: Secondary | ICD-10-CM

## 2022-04-08 DIAGNOSIS — J449 Chronic obstructive pulmonary disease, unspecified: Secondary | ICD-10-CM

## 2022-04-08 DIAGNOSIS — F17200 Nicotine dependence, unspecified, uncomplicated: Secondary | ICD-10-CM

## 2022-04-08 DIAGNOSIS — E119 Type 2 diabetes mellitus without complications: Secondary | ICD-10-CM

## 2022-04-08 DIAGNOSIS — E1169 Type 2 diabetes mellitus with other specified complication: Secondary | ICD-10-CM

## 2022-04-08 NOTE — Progress Notes (Signed)
BP 127/73   Pulse 92   Temp 97.8 F (36.6 C)   Wt 201 lb (91.2 kg)   SpO2 99%   BMI 32.44 kg/m    Subjective:    Patient ID: Thomas Johns, male    DOB: 12/26/1960, 61 y.o.   MRN: [id number]  HPI: ALVON NYGAARD is a 61 y.o. male presenting on 04/08/2022 for Diabetes, COPD, Hypertension, and Hyperlipidemia   HPI    Chief Complaint  Patient presents with   Diabetes   COPD   Hypertension   Hyperlipidemia    Pt is in today for routine follow up.  He says he Hasn't been needing nebs lately.  He continues to smoke 2ppd but says the fluticasone-salmeterol inhaler is helping.   He doesn't report any dyspnea but doesn't really exert himself.   He has a scratch left forearm from a relative's puppy that jumped up on him.  He got dog scratch over a week ago/on December 9.  He used peroxide and alcohol on it initially.  He pulled some scab off today because the bandage got stuck to the wound.    Pt cancelled 01/24/22 appt for CT   Pt is without complaint today.   His niece is with him today.        Relevant past medical, surgical, family and social history reviewed and updated as indicated. Interim medical history since our last visit reviewed. Allergies and medications reviewed and updated.    Current Outpatient Medications:    albuterol (VENTOLIN HFA) 108 (90 Base) MCG/ACT inhaler, INHALE 2 PUFFS BY MOUTH EVERY 6 HOURS AS NEEDED FOR COUGHING, WHEEZING, OR SHORTNESS OF BREATH, Disp: 20.1 g, Rfl: 0   atorvastatin (LIPITOR) 40 MG tablet, TAKE 1 Tablet BY MOUTH ONCE DAILy, Disp: 90 tablet, Rfl: 1   fluticasone-salmeterol (ADVAIR HFA) 115-21 MCG/ACT inhaler, Inhale 2 puffs into the lungs 2 (two) times daily., Disp: , Rfl:    losartan (COZAAR) 100 MG tablet, TAKE 1 Tablet BY MOUTH ONCE EVERY DAY, Disp: 90 tablet, Rfl: 0   metFORMIN (GLUCOPHAGE) 1000 MG tablet, TAKE 1 Tablet  BY MOUTH TWICE DAILY WITH A MEAL, Disp: 180 tablet, Rfl: 1   albuterol (PROVENTIL) (2.5 MG/3ML)  0.083% nebulizer solution, Take 3 mLs (2.5 mg total) by nebulization every 6 (six) hours as needed for wheezing or shortness of breath. (Patient not taking: Reported on 04/08/2022), Disp: 75 mL, Rfl: 0   Cholecalciferol (D-3-5) 125 MCG (5000 UT) capsule, Take 5,000 Units by mouth daily., Disp: , Rfl:    furosemide (LASIX) 20 MG tablet, TAKE 1 Tablet BY MOUTH ONCE EVERY DAY AS NEEDED FOR EDEMA (Patient not taking: Reported on 04/08/2022), Disp: 90 tablet, Rfl: 0   Multiple Vitamins-Minerals (CENTRUM SILVER 50+MEN) TABS, Take by mouth. (Patient not taking: Reported on 10/08/2021), Disp: , Rfl:    Omega-3 Fatty Acids (FISH OIL) 1200 MG CAPS, Take by mouth. (Patient not taking: Reported on 04/08/2022), Disp: , Rfl:    potassium chloride (KLOR-CON) 10 MEQ tablet, Take 1/2 tablet po qd when taking furosemide (Patient not taking: Reported on 01/07/2022), Disp: 90 tablet, Rfl: 0    Review of Systems  Per HPI unless specifically indicated above     Objective:    BP 127/73   Pulse 92   Temp 97.8 F (36.6 C)   Wt 201 lb (91.2 kg)   SpO2 99%   BMI 32.44 kg/m   Wt Readings from Last 3 Encounters:  04/08/22 201 lb (91.2 kg)  01/07/22 198 lb 8 oz (90 kg)  11/21/21 204 lb (92.5 kg)    Physical Exam Vitals reviewed.  Constitutional:      General: He is not in acute distress.    Appearance: He is well-developed. He is not toxic-appearing.  HENT:     Head: Normocephalic and atraumatic.  Cardiovascular:     Rate and Rhythm: Normal rate and regular rhythm.  Pulmonary:     Effort: Pulmonary effort is normal.     Breath sounds: Normal breath sounds. No wheezing.  Abdominal:     General: Bowel sounds are normal.     Palpations: Abdomen is soft.     Tenderness: There is no abdominal tenderness.  Musculoskeletal:     Left forearm: Laceration present.     Cervical back: Neck supple.     Right lower leg: No edema.     Left lower leg: No edema.     Comments: Superficial laceration x 2 left forearm  and skin tear.  The 2 superficial lacerations are closed, 1 with dark colored scab and the other with early scar.  These are perpendicular to the radius and proximal to the skin tear.   The skin tear is open but without any drainage or other signs infection.  The skin tear is parallel to radius and distal to the superficial   Lymphadenopathy:     Cervical: No cervical adenopathy.  Skin:    General: Skin is warm and dry.  Neurological:     Mental Status: He is alert and oriented to person, place, and time.     Motor: No weakness or tremor.  Psychiatric:        Behavior: Behavior normal.         Assessment & Plan:   Encounter Diagnoses  Name Primary?   Controlled type 2 diabetes mellitus without complication, without long-term current use of insulin ([place]) Yes   Primary hypertension    Hyperlipidemia associated with type 2 diabetes mellitus ([place])    Chronic obstructive pulmonary disease, unspecified COPD type ([place])    Tobacco use disorder    Wound of left upper extremity, initial encounter        -The skin wounds are cleaned with hibiclens.   Applied bacitracin and dry sterile non-stick dressing.  Kerlix used to hold dressing in place.   Pt tetanus was in 2018.  Pt was educated to avoid alcohol and H2O2.  He is encouraged to wash with soap and water and keep the skin tear covered until closed.  He can use ointment if desired.  He is urged to RTO if he gets concern for infection.  -Pt is given FIT test for colon cancer screening.  He has history polyps and needs colonoscopy.   Last note from GI office 02/2021.   Pt was reminded at 01/07/22 OV to call GI to r/s colonoscopy -Pt to get fasting labs drawn.  He will be called with results -pt was urged to reduce smoking.  Recommended cut back to 1 ppd -no changes today -pt to follow up 63month.  He is to contact office sooner prn

## 2022-05-01 ENCOUNTER — Other Ambulatory Visit: Payer: Self-pay | Admitting: Physician Assistant

## 2022-05-02 ENCOUNTER — Telehealth: Payer: Self-pay | Admitting: Student

## 2022-05-02 NOTE — Telephone Encounter (Signed)
LPN called and lvm reminding pt about overdue fasting labwork and to remind him to r/s with GI

## 2022-05-14 NOTE — Congregational Nurse Program (Signed)
  Dept: [phone]   Congregational Nurse Program Note  Date of Encounter: 05/14/2022  Past Medical History: Past Medical History:  Diagnosis Date   Hypertension     Encounter Details:  CNP Questionnaire - 05/14/22 1432       Questionnaire   Ask client: Do you give verbal consent for me to treat you today? Yes    Student Assistance N/A    Location Patient [facility] [place]    Visit Setting with Client Organization    Patient Status Unknown    Insurance Uninsured ([facility] [employer]/Self-Pay/Medicaid Family Planning)    Insurance/Financial Assistance Referral N/A    Medication N/A    Medical Provider Yes    Medical Referrals Made N/A    Medical Appointment Made N/A    Recently w/o PCP, now 1st time PCP visit completed due to CNs referral or appointment made N/A    Food Have Food Insecurities;Referred to [facility] food [facility]   Transportation N/A    Housing/Utilities N/A    Chiropractor N/A    Interventions Advocate/Support    Abnormal to Normal Screening Since Last CN Visit N/A    Screenings CN Performed N/A    Sent Client to Lab for: N/A    Did client attend any of the following based off CNs referral or appointments made? N/A    ED Visit Averted N/A    Life-Saving Intervention Made N/A

## 2022-05-14 NOTE — Congregational Nurse Program (Signed)
Patient presented to onsite [facility] for return visit and received 6.20 lbs of food for family of 1

## 2022-05-20 ENCOUNTER — Other Ambulatory Visit (HOSPITAL_COMMUNITY)
Admission: RE | Admit: 2022-05-20 | Discharge: 2022-05-20 | Disposition: A | Discharge status: Home / Self Care | Payer: Self-pay | Source: Ambulatory Visit | Attending: Physician Assistant | Admitting: Physician Assistant

## 2022-05-20 DIAGNOSIS — I10 Essential (primary) hypertension: Secondary | ICD-10-CM | POA: Insufficient documentation

## 2022-05-20 DIAGNOSIS — E1169 Type 2 diabetes mellitus with other specified complication: Secondary | ICD-10-CM | POA: Insufficient documentation

## 2022-05-20 DIAGNOSIS — E785 Hyperlipidemia, unspecified: Secondary | ICD-10-CM | POA: Insufficient documentation

## 2022-05-20 DIAGNOSIS — E119 Type 2 diabetes mellitus without complications: Secondary | ICD-10-CM | POA: Insufficient documentation

## 2022-05-20 LAB — COMPREHENSIVE METABOLIC PANEL
ALT: 12 U/L (ref 0–44)
AST: 11 U/L — ABNORMAL LOW (ref 15–41)
Albumin: 4 g/dL (ref 3.5–5.0)
Alkaline Phosphatase: 83 U/L (ref 38–126)
Anion gap: 7 (ref 5–15)
BUN: 12 mg/dL (ref 6–20)
CO2: 25 mmol/L (ref 22–32)
Calcium: 8.8 mg/dL — ABNORMAL LOW (ref 8.9–10.3)
Chloride: 104 mmol/L (ref 98–111)
Creatinine, Ser: 0.77 mg/dL (ref 0.61–1.24)
GFR, Estimated: >60 mL/min (ref >60)
Glucose, Bld: 158 mg/dL — ABNORMAL HIGH (ref 70–99)
Potassium: 4 mmol/L (ref 3.5–5.1)
Sodium: 136 mmol/L (ref 135–145)
Total Bilirubin: 0.4 mg/dL (ref 0.3–1.2)
Total Protein: 6.9 g/dL (ref 6.5–8.1)

## 2022-05-20 LAB — LIPID PANEL
Cholesterol: 100 mg/dL (ref 0–200)
HDL: 24 mg/dL — ABNORMAL LOW (ref >40)
LDL Cholesterol: 62 mg/dL (ref 0–99)
Total CHOL/HDL Ratio: 4.2 RATIO
Triglycerides: 72 mg/dL (ref <150)
VLDL: 14 mg/dL (ref 0–40)

## 2022-05-21 LAB — HEMOGLOBIN A1C
Hgb A1c MFr Bld: 6.2 % — ABNORMAL HIGH (ref 4.8–5.6)
Mean Plasma Glucose: 131 mg/dL

## 2022-05-21 LAB — MICROALBUMIN, URINE: Microalb, Ur: 6.8 ug/mL — ABNORMAL HIGH

## 2022-06-17 ENCOUNTER — Other Ambulatory Visit: Payer: Self-pay | Admitting: Physician Assistant

## 2022-07-08 ENCOUNTER — Ambulatory Visit: Payer: Self-pay | Admitting: Physician Assistant

## 2022-07-08 ENCOUNTER — Encounter: Payer: Self-pay | Admitting: Physician Assistant

## 2022-07-08 VITALS — BP 150/83 | HR 81 | Temp 97.6°F | Wt 196.5 lb

## 2022-07-08 DIAGNOSIS — J449 Chronic obstructive pulmonary disease, unspecified: Secondary | ICD-10-CM

## 2022-07-08 DIAGNOSIS — I10 Essential (primary) hypertension: Secondary | ICD-10-CM

## 2022-07-08 DIAGNOSIS — F17200 Nicotine dependence, unspecified, uncomplicated: Secondary | ICD-10-CM

## 2022-07-08 DIAGNOSIS — Z5320 Procedure and treatment not carried out because of patient's decision for unspecified reasons: Secondary | ICD-10-CM

## 2022-07-08 NOTE — Progress Notes (Signed)
BP (!) 150/83   Pulse 81   Temp 97.6 F (36.4 C)   Wt 196 lb 8 oz (89.1 kg)   SpO2 99%   BMI 31.72 kg/m    Subjective:    Patient ID: Thomas Johns, male    DOB: 02-03-1961, 62 y.o.   MRN: [id number]  HPI: Thomas Johns is a 62 y.o. male presenting on 07/08/2022 for Diabetes, COPD, and Hyperlipidemia   HPI   Chief Complaint  Patient presents with   Diabetes   COPD   Hyperlipidemia    He didn't bring his meds with him today and he doesn't have any idea what he is taking.  He says he is doing well lately.  He says he has been sleeeping a lot lately because he got bored with what is on TV.    He says he doesn't sleep well at night.  Labs not due until end of April. (Labs ordered at 04/08/22 OV not drawn until 05/20/22)  Pt has still not rescheduled colonoscopy with GI.   He says he jsut doesn't want to do the prep.     When asked about the CT scan that was scheduled for 01/24/22 that he was a no-show for,  Pt says he doesn't want to do any CT scans- ie r/s CT already ordered for weight loss or a chest CT to screen for lung CA.    He says he just doesn't want to do any of that testing type of thing.   He says he is still smoking 2 ppd.   Relevant past medical, surgical, family and social history reviewed and updated as indicated. Interim medical history since our last visit reviewed. Allergies and medications reviewed and updated.   Current Outpatient Medications:    albuterol (PROVENTIL) (2.5 MG/3ML) 0.083% nebulizer solution, Take 3 mLs (2.5 mg total) by nebulization every 6 (six) hours as needed for wheezing or shortness of breath. (Patient not taking: Reported on 04/08/2022), Disp: 75 mL, Rfl: 0   albuterol (VENTOLIN HFA) 108 (90 Base) MCG/ACT inhaler, INHALE 2 PUFFS BY MOUTH EVERY 6 HOURS AS NEEDED FOR COUGHING, WHEEZING, OR SHORTNESS OF BREATH, Disp: 20.1 g, Rfl: 0   atorvastatin (LIPITOR) 40 MG tablet, TAKE 1 Tablet BY MOUTH ONCE DAILY, Disp: 90 tablet, Rfl: 0    Cholecalciferol (D-3-5) 125 MCG (5000 UT) capsule, Take 5,000 Units by mouth daily., Disp: , Rfl:    fluticasone-salmeterol (ADVAIR HFA) 115-21 MCG/ACT inhaler, Inhale 2 puffs into the lungs 2 (two) times daily., Disp: , Rfl:    furosemide (LASIX) 20 MG tablet, TAKE 1 Tablet BY MOUTH ONCE EVERY DAY AS NEEDED FOR EDEMA (Patient not taking: Reported on 04/08/2022), Disp: 90 tablet, Rfl: 0   losartan (COZAAR) 100 MG tablet, TAKE 1 Tablet BY MOUTH ONCE EVERY DAY, Disp: 90 tablet, Rfl: 0   metFORMIN (GLUCOPHAGE) 1000 MG tablet, TAKE 1 Tablet  BY MOUTH TWICE DAILY WITH A MEAL, Disp: 180 tablet, Rfl: 0   Multiple Vitamins-Minerals (CENTRUM SILVER 50+MEN) TABS, Take by mouth. (Patient not taking: Reported on 10/08/2021), Disp: , Rfl:    Omega-3 Fatty Acids (FISH OIL) 1200 MG CAPS, Take by mouth. (Patient not taking: Reported on 04/08/2022), Disp: , Rfl:    potassium chloride (KLOR-CON) 10 MEQ tablet, Take 1/2 tablet po qd when taking furosemide (Patient not taking: Reported on 01/07/2022), Disp: 90 tablet, Rfl: 0    Review of Systems  Per HPI unless specifically indicated above     Objective:  BP (!) 150/83   Pulse 81   Temp 97.6 F (36.4 C)   Wt 196 lb 8 oz (89.1 kg)   SpO2 99%   BMI 31.72 kg/m   Wt Readings from Last 3 Encounters:  07/08/22 196 lb 8 oz (89.1 kg)  04/08/22 201 lb (91.2 kg)  01/07/22 198 lb 8 oz (90 kg)    Physical Exam Vitals reviewed.  Constitutional:      General: He is not in acute distress.    Appearance: He is not toxic-appearing.     Comments: Strong smell of cigarette smoke  HENT:     Head: Normocephalic and atraumatic.  Cardiovascular:     Rate and Rhythm: Normal rate and regular rhythm.  Pulmonary:     Breath sounds: Normal breath sounds. No wheezing or rhonchi.  Abdominal:     General: Bowel sounds are normal.     Palpations: Abdomen is soft.     Tenderness: There is no abdominal tenderness.  Musculoskeletal:     Cervical back: Neck supple.      Right lower leg: No edema.     Left lower leg: No edema.  Lymphadenopathy:     Cervical: No cervical adenopathy.  Skin:    General: Skin is warm and dry.  Neurological:     Mental Status: He is alert and oriented to person, place, and time.  Psychiatric:        Behavior: Behavior normal. Behavior is cooperative.           Assessment & Plan:    Encounter Diagnoses  Name Primary?   Primary hypertension Yes   Chronic obstructive pulmonary disease, unspecified COPD type ([place])    Tobacco use disorder    Imaging study declined      -discussed with pt that his bp is high today.  Can't adjust meds because no way to know what he is actually taking now.  Pt will RTO in one week with all his meds for a recheck of the bp.   -as stated above, pt declines CT testing and colonoscopy even though they might alert for early cancer when treatment could be most beneficial

## 2022-07-08 NOTE — Patient Instructions (Addendum)
[facility] is now called [facility] at [facility] [address] [place],  [place]  [address]  Main: [phone]

## 2022-07-15 ENCOUNTER — Ambulatory Visit: Payer: Self-pay | Admitting: Physician Assistant

## 2022-07-15 ENCOUNTER — Other Ambulatory Visit: Payer: Self-pay | Admitting: Physician Assistant

## 2022-07-15 ENCOUNTER — Encounter: Payer: Self-pay | Admitting: Physician Assistant

## 2022-07-15 VITALS — BP 145/91 | HR 76 | Temp 97.6°F

## 2022-07-15 DIAGNOSIS — F17200 Nicotine dependence, unspecified, uncomplicated: Secondary | ICD-10-CM

## 2022-07-15 DIAGNOSIS — E119 Type 2 diabetes mellitus without complications: Secondary | ICD-10-CM

## 2022-07-15 DIAGNOSIS — I10 Essential (primary) hypertension: Secondary | ICD-10-CM

## 2022-07-15 DIAGNOSIS — J449 Chronic obstructive pulmonary disease, unspecified: Secondary | ICD-10-CM

## 2022-07-15 DIAGNOSIS — E1169 Type 2 diabetes mellitus with other specified complication: Secondary | ICD-10-CM

## 2022-07-15 MED ORDER — AMLODIPINE BESYLATE 5 MG PO TABS: 5.0000 mg | ORAL_TABLET | Freq: Every day | ORAL | 0 refills | Status: DC

## 2022-07-15 NOTE — Progress Notes (Signed)
BP (!) 145/91   Pulse 76   Temp 97.6 F (36.4 C)   SpO2 99%    Subjective:    Patient ID: Thomas Johns, male    DOB: 1960/10/10, 62 y.o.   MRN: [id number]  HPI: Thomas Johns is a 62 y.o. male presenting on 07/15/2022 for Hypertension   HPI   Chief Complaint  Patient presents with   Hypertension    Pt is in today for recheck bp.  He has his medications with him today.   He says he is feeling fine and denies chest pain and abdominal pain.    Relevant past medical, surgical, family and social history reviewed and updated as indicated. Interim medical history since our last visit reviewed. Allergies and medications reviewed and updated.   Current Outpatient Medications:    albuterol (PROVENTIL) (2.5 MG/3ML) 0.083% nebulizer solution, Take 3 mLs (2.5 mg total) by nebulization every 6 (six) hours as needed for wheezing or shortness of breath., Disp: 75 mL, Rfl: 0   albuterol (VENTOLIN HFA) 108 (90 Base) MCG/ACT inhaler, INHALE 2 PUFFS BY MOUTH EVERY 6 HOURS AS NEEDED FOR COUGHING, WHEEZING, OR SHORTNESS OF BREATH, Disp: 20.1 g, Rfl: 0   atorvastatin (LIPITOR) 40 MG tablet, TAKE 1 Tablet BY MOUTH ONCE DAILY, Disp: 90 tablet, Rfl: 0   fluticasone-salmeterol (ADVAIR HFA) 115-21 MCG/ACT inhaler, Inhale 2 puffs into the lungs 2 (two) times daily., Disp: , Rfl:    losartan (COZAAR) 100 MG tablet, TAKE 1 Tablet BY MOUTH ONCE EVERY DAY, Disp: 90 tablet, Rfl: 0   metFORMIN (GLUCOPHAGE) 1000 MG tablet, TAKE 1 Tablet  BY MOUTH TWICE DAILY WITH A MEAL, Disp: 180 tablet, Rfl: 0   Cholecalciferol (D-3-5) 125 MCG (5000 UT) capsule, Take 5,000 Units by mouth daily. (Patient not taking: Reported on 07/15/2022), Disp: , Rfl:    furosemide (LASIX) 20 MG tablet, TAKE 1 Tablet BY MOUTH ONCE EVERY DAY AS NEEDED FOR EDEMA (Patient not taking: Reported on 04/08/2022), Disp: 90 tablet, Rfl: 0   Multiple Vitamins-Minerals (CENTRUM SILVER 50+MEN) TABS, Take by mouth. (Patient not taking: Reported on  10/08/2021), Disp: , Rfl:    Omega-3 Fatty Acids (FISH OIL) 1200 MG CAPS, Take by mouth. (Patient not taking: Reported on 04/08/2022), Disp: , Rfl:    potassium chloride (KLOR-CON) 10 MEQ tablet, Take 1/2 tablet po qd when taking furosemide (Patient not taking: Reported on 07/15/2022), Disp: 90 tablet, Rfl: 0    Review of Systems  Per HPI unless specifically indicated above     Objective:    BP (!) 145/91   Pulse 76   Temp 97.6 F (36.4 C)   SpO2 99%   Wt Readings from Last 3 Encounters:  07/08/22 196 lb 8 oz (89.1 kg)  04/08/22 201 lb (91.2 kg)  01/07/22 198 lb 8 oz (90 kg)    Physical Exam Vitals reviewed.  Constitutional:      General: He is not in acute distress.    Appearance: He is well-developed. He is not toxic-appearing.  HENT:     Head: Normocephalic and atraumatic.  Cardiovascular:     Rate and Rhythm: Normal rate and regular rhythm.  Pulmonary:     Effort: Pulmonary effort is normal.     Breath sounds: Normal breath sounds. No wheezing.  Abdominal:     General: Bowel sounds are normal.     Palpations: Abdomen is soft.     Tenderness: There is no abdominal tenderness.  Musculoskeletal:  Cervical back: Neck supple.     Right lower leg: No edema.     Left lower leg: No edema.  Lymphadenopathy:     Cervical: No cervical adenopathy.  Skin:    General: Skin is warm and dry.  Neurological:     Mental Status: He is alert and oriented to person, place, and time.  Psychiatric:        Behavior: Behavior normal.            Assessment & Plan:    Encounter Diagnoses  Name Primary?   Primary hypertension Yes   Chronic obstructive pulmonary disease, unspecified COPD type ([place])    Tobacco use disorder    Controlled type 2 diabetes mellitus without complication, without long-term current use of insulin ([place])    Hyperlipidemia associated with type 2 diabetes mellitus ([place])      -Add amlodipine 5mg .  Continue current medications -refer for DM eye exam  which is due mid-April -pt to follow up 1 month to recheck bp.  He is to contact office sooner prn

## 2022-07-25 ENCOUNTER — Other Ambulatory Visit: Payer: Self-pay | Admitting: Physician Assistant

## 2022-07-25 DIAGNOSIS — I10 Essential (primary) hypertension: Secondary | ICD-10-CM

## 2022-07-25 DIAGNOSIS — E119 Type 2 diabetes mellitus without complications: Secondary | ICD-10-CM

## 2022-07-25 DIAGNOSIS — E1169 Type 2 diabetes mellitus with other specified complication: Secondary | ICD-10-CM

## 2022-08-13 ENCOUNTER — Other Ambulatory Visit (HOSPITAL_COMMUNITY)
Admission: RE | Admit: 2022-08-13 | Discharge: 2022-08-13 | Disposition: A | Discharge status: Home / Self Care | Payer: Self-pay | Source: Ambulatory Visit | Attending: Physician Assistant | Admitting: Physician Assistant

## 2022-08-13 DIAGNOSIS — E785 Hyperlipidemia, unspecified: Secondary | ICD-10-CM | POA: Insufficient documentation

## 2022-08-13 DIAGNOSIS — E1169 Type 2 diabetes mellitus with other specified complication: Secondary | ICD-10-CM | POA: Insufficient documentation

## 2022-08-13 DIAGNOSIS — I10 Essential (primary) hypertension: Secondary | ICD-10-CM

## 2022-08-13 DIAGNOSIS — E119 Type 2 diabetes mellitus without complications: Secondary | ICD-10-CM | POA: Insufficient documentation

## 2022-08-13 LAB — COMPREHENSIVE METABOLIC PANEL
ALT: 21 U/L (ref 0–44)
AST: 13 U/L — ABNORMAL LOW (ref 15–41)
Albumin: 4 g/dL (ref 3.5–5.0)
Alkaline Phosphatase: 104 U/L (ref 38–126)
Anion gap: 8 (ref 5–15)
BUN: 11 mg/dL (ref 6–20)
CO2: 24 mmol/L (ref 22–32)
Calcium: 8.6 mg/dL — ABNORMAL LOW (ref 8.9–10.3)
Chloride: 102 mmol/L (ref 98–111)
Creatinine, Ser: 0.84 mg/dL (ref 0.61–1.24)
GFR, Estimated: >60 mL/min (ref >60)
Glucose, Bld: 109 mg/dL — ABNORMAL HIGH (ref 70–99)
Potassium: 4.2 mmol/L (ref 3.5–5.1)
Sodium: 134 mmol/L — ABNORMAL LOW (ref 135–145)
Total Bilirubin: 0.7 mg/dL (ref 0.3–1.2)
Total Protein: 7 g/dL (ref 6.5–8.1)

## 2022-08-13 LAB — LIPID PANEL
Cholesterol: 91 mg/dL (ref 0–200)
HDL: 23 mg/dL — ABNORMAL LOW (ref >40)
LDL Cholesterol: 55 mg/dL (ref 0–99)
Total CHOL/HDL Ratio: 4 RATIO
Triglycerides: 65 mg/dL (ref <150)
VLDL: 13 mg/dL (ref 0–40)

## 2022-08-14 ENCOUNTER — Ambulatory Visit: Payer: Self-pay | Admitting: Physician Assistant

## 2022-08-14 ENCOUNTER — Encounter: Payer: Self-pay | Admitting: Physician Assistant

## 2022-08-14 VITALS — BP 126/69 | HR 72 | Temp 97.9°F | Wt 197.5 lb

## 2022-08-14 DIAGNOSIS — I10 Essential (primary) hypertension: Secondary | ICD-10-CM

## 2022-08-14 DIAGNOSIS — Z2821 Immunization not carried out because of patient refusal: Secondary | ICD-10-CM

## 2022-08-14 DIAGNOSIS — J449 Chronic obstructive pulmonary disease, unspecified: Secondary | ICD-10-CM

## 2022-08-14 DIAGNOSIS — L603 Nail dystrophy: Secondary | ICD-10-CM

## 2022-08-14 DIAGNOSIS — E1169 Type 2 diabetes mellitus with other specified complication: Secondary | ICD-10-CM

## 2022-08-14 DIAGNOSIS — E119 Type 2 diabetes mellitus without complications: Secondary | ICD-10-CM

## 2022-08-14 DIAGNOSIS — F17200 Nicotine dependence, unspecified, uncomplicated: Secondary | ICD-10-CM

## 2022-08-14 LAB — HEMOGLOBIN A1C
Hgb A1c MFr Bld: 6 % — ABNORMAL HIGH (ref 4.8–5.6)
Mean Plasma Glucose: 126 mg/dL

## 2022-08-14 NOTE — Progress Notes (Signed)
BP 126/69   Pulse 72   Temp 97.9 F (36.6 C)   Wt 197 lb 8 oz (89.6 kg)   SpO2 96%   BMI 31.88 kg/m    Subjective:    Patient ID: Thomas Johns, male    DOB: 1960-10-25, 62 y.o.   MRN: [id number]  HPI: ALDRIDGE KRZYZANOWSKI is a 62 y.o. male presenting on 08/14/2022 for Hypertension, Diabetes, and Hyperlipidemia   HPI    Chief Complaint  Patient presents with   Hypertension   Diabetes   Hyperlipidemia    Pt is Still smoking.  He says his breathing is good.  He has no complaints.  Discussed at 07/08/22 OV that pt needed to r/s with GI for colonoscopy but he wasn't going to do that  Pt didn't bring his meds with him today and he doesn't know what he is taking.   Relevant past medical, surgical, family and social history reviewed and updated as indicated. Interim medical history since our last visit reviewed. Allergies and medications reviewed and updated.   Current Outpatient Medications:    albuterol (PROVENTIL) (2.5 MG/3ML) 0.083% nebulizer solution, Take 3 mLs (2.5 mg total) by nebulization every 6 (six) hours as needed for wheezing or shortness of breath., Disp: 75 mL, Rfl: 0   albuterol (VENTOLIN HFA) 108 (90 Base) MCG/ACT inhaler, INHALE 2 PUFFS BY MOUTH EVERY 6 HOURS AS NEEDED FOR COUGHING, WHEEZING, OR SHORTNESS OF BREATH, Disp: 20.1 g, Rfl: 0   amLODipine (NORVASC) 5 MG tablet, Take 1 tablet (5 mg total) by mouth daily., Disp: 90 tablet, Rfl: 0   atorvastatin (LIPITOR) 40 MG tablet, TAKE 1 Tablet BY MOUTH ONCE DAILY, Disp: 90 tablet, Rfl: 0   Cholecalciferol (D-3-5) 125 MCG (5000 UT) capsule, Take 5,000 Units by mouth daily. (Patient not taking: Reported on 07/15/2022), Disp: , Rfl:    fluticasone-salmeterol (ADVAIR HFA) 115-21 MCG/ACT inhaler, Inhale 2 puffs into the lungs 2 (two) times daily., Disp: , Rfl:    furosemide (LASIX) 20 MG tablet, TAKE 1 Tablet BY MOUTH ONCE EVERY DAY AS NEEDED FOR EDEMA, Disp: 90 tablet, Rfl: 0   losartan (COZAAR) 100 MG tablet, TAKE 1  Tablet BY MOUTH ONCE EVERY DAY, Disp: 90 tablet, Rfl: 0   metFORMIN (GLUCOPHAGE) 1000 MG tablet, TAKE 1 Tablet  BY MOUTH TWICE DAILY WITH A MEAL, Disp: 180 tablet, Rfl: 0   Multiple Vitamins-Minerals (CENTRUM SILVER 50+MEN) TABS, Take by mouth. (Patient not taking: Reported on 10/08/2021), Disp: , Rfl:    Omega-3 Fatty Acids (FISH OIL) 1200 MG CAPS, Take by mouth. (Patient not taking: Reported on 04/08/2022), Disp: , Rfl:    potassium chloride (KLOR-CON) 10 MEQ tablet, Take 1/2 tablet po qd when taking furosemide (Patient not taking: Reported on 07/15/2022), Disp: 90 tablet, Rfl: 0   Review of Systems  Per HPI unless specifically indicated above     Objective:    BP 126/69   Pulse 72   Temp 97.9 F (36.6 C)   Wt 197 lb 8 oz (89.6 kg)   SpO2 96%   BMI 31.88 kg/m   Wt Readings from Last 3 Encounters:  08/14/22 197 lb 8 oz (89.6 kg)  07/08/22 196 lb 8 oz (89.1 kg)  04/08/22 201 lb (91.2 kg)    Physical Exam Vitals reviewed.  Constitutional:      General: He is not in acute distress.    Appearance: He is well-developed. He is not toxic-appearing.  HENT:     Head: Normocephalic  and atraumatic.  Cardiovascular:     Rate and Rhythm: Normal rate and regular rhythm.  Pulmonary:     Effort: Pulmonary effort is normal.     Breath sounds: Normal breath sounds. No wheezing.  Abdominal:     General: Bowel sounds are normal.     Palpations: Abdomen is soft.     Tenderness: There is no abdominal tenderness.  Musculoskeletal:     Cervical back: Neck supple.     Right lower leg: No edema.     Left lower leg: No edema.  Lymphadenopathy:     Cervical: No cervical adenopathy.  Skin:    General: Skin is warm and dry.  Neurological:     Mental Status: He is alert and oriented to person, place, and time.  Psychiatric:        Behavior: Behavior normal.     Results for orders placed or performed during the hospital encounter of 08/13/22  Hemoglobin A1c  Result Value Ref Range   Hgb  A1c MFr Bld 6.0 (H) 4.8 - 5.6 %   Mean Plasma Glucose 126 mg/dL  Lipid panel  Result Value Ref Range   Cholesterol 91 0 - 200 mg/dL   Triglycerides 65 <784 mg/dL   HDL 23 (L) >69 mg/dL   Total CHOL/HDL Ratio 4.0 RATIO   VLDL 13 0 - 40 mg/dL   LDL Cholesterol 55 0 - 99 mg/dL  Comprehensive metabolic panel  Result Value Ref Range   Sodium 134 (L) 135 - 145 mmol/L   Potassium 4.2 3.5 - 5.1 mmol/L   Chloride 102 98 - 111 mmol/L   CO2 24 22 - 32 mmol/L   Glucose, Bld 109 (H) 70 - 99 mg/dL   BUN 11 6 - 20 mg/dL   Creatinine, Ser 6.29 0.61 - 1.24 mg/dL   Calcium 8.6 (L) 8.9 - 10.3 mg/dL   Total Protein 7.0 6.5 - 8.1 g/dL   Albumin 4.0 3.5 - 5.0 g/dL   AST 13 (L) 15 - 41 U/L   ALT 21 0 - 44 U/L   Alkaline Phosphatase 104 38 - 126 U/L   Total Bilirubin 0.7 0.3 - 1.2 mg/dL   GFR, Estimated >52 >84 mL/min   Anion gap 8 5 - 15          Assessment & Plan:    Encounter Diagnoses  Name Primary?   Primary hypertension Yes   Controlled type 2 diabetes mellitus without complication, without long-term current use of insulin    Hyperlipidemia associated with type 2 diabetes mellitus    Chronic obstructive pulmonary disease, unspecified COPD type    Tobacco use disorder    Dystrophic nail    COVID-19 vaccination declined       -reviewed labs with pt  -pt to continue current medications -Pt is on list for  DM eye exam.  He was offered appointment 08/21/22 in [place] but he declined and prefers to wait for appointment in [place] -DM foot exam updated today -pt Declines covid vaccination today -Pt declined referral to podiatrist for his abnormal right 5th toenail -recommended smoking cessation or reduction -pt is requested to call office when he gets home to give [place] his telephone number -pt is reminded that it is helpful to bring all meds to every appointment -pt to follow up 4 months.  He is to contact office sooner prn

## 2022-08-14 NOTE — Patient Instructions (Addendum)
Please call our office- [phone]- to give [place] your telephone number  Please bring all of your medications to every appointment.

## 2022-11-25 ENCOUNTER — Other Ambulatory Visit: Payer: Self-pay | Admitting: Physician Assistant

## 2022-11-25 DIAGNOSIS — I10 Essential (primary) hypertension: Secondary | ICD-10-CM

## 2022-11-25 DIAGNOSIS — E1169 Type 2 diabetes mellitus with other specified complication: Secondary | ICD-10-CM

## 2022-11-25 DIAGNOSIS — E119 Type 2 diabetes mellitus without complications: Secondary | ICD-10-CM

## 2022-11-25 DIAGNOSIS — Z125 Encounter for screening for malignant neoplasm of prostate: Secondary | ICD-10-CM

## 2022-12-02 ENCOUNTER — Telehealth: Payer: Self-pay

## 2022-12-02 NOTE — Telephone Encounter (Signed)
Attempted wellness call to [facility] client, no answer, will plan to follow up when [place] [facility] [place] 12[place]03/22.  Francee Nodal RN [employer]

## 2022-12-17 ENCOUNTER — Encounter: Payer: Self-pay | Admitting: Physician Assistant

## 2022-12-17 ENCOUNTER — Ambulatory Visit: Payer: Self-pay | Admitting: Physician Assistant

## 2022-12-17 ENCOUNTER — Other Ambulatory Visit: Payer: Self-pay | Admitting: Physician Assistant

## 2022-12-17 VITALS — BP 122/64 | HR 90 | Temp 97.8°F | Wt 192.5 lb

## 2022-12-17 DIAGNOSIS — J449 Chronic obstructive pulmonary disease, unspecified: Secondary | ICD-10-CM

## 2022-12-17 DIAGNOSIS — Z2821 Immunization not carried out because of patient refusal: Secondary | ICD-10-CM

## 2022-12-17 DIAGNOSIS — F17200 Nicotine dependence, unspecified, uncomplicated: Secondary | ICD-10-CM

## 2022-12-17 DIAGNOSIS — Z1211 Encounter for screening for malignant neoplasm of colon: Secondary | ICD-10-CM

## 2022-12-17 DIAGNOSIS — I10 Essential (primary) hypertension: Secondary | ICD-10-CM

## 2022-12-17 DIAGNOSIS — E119 Type 2 diabetes mellitus without complications: Secondary | ICD-10-CM

## 2022-12-17 DIAGNOSIS — E1169 Type 2 diabetes mellitus with other specified complication: Secondary | ICD-10-CM

## 2022-12-17 NOTE — Progress Notes (Signed)
BP 122/64   Pulse 90   Temp 97.8 F (36.6 C)   Wt 192 lb 8 oz (87.3 kg)   SpO2 97%   BMI 31.07 kg/m    Subjective:    Patient ID: Thomas Johns, male    DOB: 12/06/1960, 62 y.o.   MRN: [id number]  HPI: Thomas Johns is a 62 y.o. male presenting on 12/17/2022 for Diabetes, Hypertension, and Hyperlipidemia   HPI    Chief Complaint  Patient presents with   Diabetes   Hypertension   Hyperlipidemia     Pt got a new kitten and he has lots of scratches on his arms. He Didn't get labs drawn He says he is doing fine and he has no complaints.    Relevant past medical, surgical, family and social history reviewed and updated as indicated. Interim medical history since our last visit reviewed. Allergies and medications reviewed and updated.   Current Outpatient Medications:    albuterol (PROVENTIL) (2.5 MG/3ML) 0.083% nebulizer solution, Take 3 mLs (2.5 mg total) by nebulization every 6 (six) hours as needed for wheezing or shortness of breath., Disp: 75 mL, Rfl: 0   amLODipine (NORVASC) 5 MG tablet, TAKE 1 Tablet BY MOUTH ONCE EVERY DAY, Disp: 90 tablet, Rfl: 0   atorvastatin (LIPITOR) 40 MG tablet, TAKE 1 Tablet BY MOUTH ONCE DAILY, Disp: 90 tablet, Rfl: 0   Cholecalciferol (D-3-5) 125 MCG (5000 UT) capsule, Take 5,000 Units by mouth daily., Disp: , Rfl:    fluticasone-salmeterol (ADVAIR HFA) 115-21 MCG/ACT inhaler, Inhale 2 puffs into the lungs 2 (two) times daily., Disp: , Rfl:    furosemide (LASIX) 20 MG tablet, TAKE 1 Tablet BY MOUTH ONCE EVERY DAY AS NEEDED FOR EDEMA, Disp: 90 tablet, Rfl: 0   losartan (COZAAR) 100 MG tablet, TAKE 1 Tablet BY MOUTH ONCE EVERY DAY, Disp: 90 tablet, Rfl: 0   metFORMIN (GLUCOPHAGE) 1000 MG tablet, TAKE 1 Tablet  BY MOUTH TWICE DAILY WITH A MEAL, Disp: 180 tablet, Rfl: 0   albuterol (VENTOLIN HFA) 108 (90 Base) MCG/ACT inhaler, INHALE 2 PUFFS BY MOUTH EVERY 6 HOURS AS NEEDED FOR COUGHING, WHEEZING, OR SHORTNESS OF BREATH (Patient not  taking: Reported on 12/17/2022), Disp: 20.1 g, Rfl: 0   Multiple Vitamins-Minerals (CENTRUM SILVER 50+MEN) TABS, Take by mouth. (Patient not taking: Reported on 10/08/2021), Disp: , Rfl:    Omega-3 Fatty Acids (FISH OIL) 1200 MG CAPS, Take by mouth. (Patient not taking: Reported on 04/08/2022), Disp: , Rfl:    potassium chloride (KLOR-CON) 10 MEQ tablet, Take 1/2 tablet po qd when taking furosemide (Patient not taking: Reported on 07/15/2022), Disp: 90 tablet, Rfl: 0    Review of Systems  Per HPI unless specifically indicated above     Objective:    BP 122/64   Pulse 90   Temp 97.8 F (36.6 C)   Wt 192 lb 8 oz (87.3 kg)   SpO2 97%   BMI 31.07 kg/m   Wt Readings from Last 3 Encounters:  12/17/22 192 lb 8 oz (87.3 kg)  08/14/22 197 lb 8 oz (89.6 kg)  07/08/22 196 lb 8 oz (89.1 kg)    Physical Exam Vitals reviewed.  Constitutional:      General: He is not in acute distress.    Appearance: He is well-developed. He is not ill-appearing.  HENT:     Head: Normocephalic and atraumatic.  Cardiovascular:     Rate and Rhythm: Normal rate and regular rhythm.  Pulmonary:  Effort: Pulmonary effort is normal.     Breath sounds: Normal breath sounds. No wheezing.  Abdominal:     General: Bowel sounds are normal.     Palpations: Abdomen is soft.     Tenderness: There is no abdominal tenderness.  Musculoskeletal:     Cervical back: Neck supple.     Right lower leg: No edema.     Left lower leg: No edema.  Lymphadenopathy:     Cervical: No cervical adenopathy.  Skin:    General: Skin is warm and dry.     Comments: Multiple superficial scratches BUE.  No signs infection  Neurological:     Mental Status: He is alert and oriented to person, place, and time.  Psychiatric:        Behavior: Behavior normal.           Assessment & Plan:   Encounter Diagnoses  Name Primary?   Controlled type 2 diabetes mellitus without complication, without long-term current use of insulin  (HCC) Yes   Primary hypertension    Hyperlipidemia associated with type 2 diabetes mellitus (HCC)    Chronic obstructive pulmonary disease, unspecified COPD type (HCC)    Tobacco use disorder    COVID-19 vaccination declined    Screening for colon cancer       -pt encouraged to get fasting labs drawn -he is given another FIT test for colon cancer screening -he is encouraged to Update enrollment -he was offered and Declined covid vax -no medication changes today -he is to follow up  3 months.  He is to contact office sooner prn

## 2022-12-17 NOTE — Patient Instructions (Signed)
-   get fasting labs drawn - return stool test for colon cancer screening - update enrollment

## 2022-12-25 ENCOUNTER — Encounter: Payer: Self-pay | Admitting: Physician Assistant

## 2023-03-25 ENCOUNTER — Other Ambulatory Visit: Payer: Self-pay | Admitting: Physician Assistant

## 2023-03-25 ENCOUNTER — Ambulatory Visit: Payer: Self-pay | Admitting: Physician Assistant

## 2023-03-27 ENCOUNTER — Encounter: Payer: Self-pay | Admitting: Physician Assistant

## 2023-03-27 ENCOUNTER — Ambulatory Visit: Payer: Self-pay | Admitting: Physician Assistant

## 2023-03-27 VITALS — BP 130/80 | HR 84 | Temp 97.9°F | Wt 196.5 lb

## 2023-03-27 DIAGNOSIS — I10 Essential (primary) hypertension: Secondary | ICD-10-CM

## 2023-03-27 DIAGNOSIS — Z5320 Procedure and treatment not carried out because of patient's decision for unspecified reasons: Secondary | ICD-10-CM

## 2023-03-27 DIAGNOSIS — E1169 Type 2 diabetes mellitus with other specified complication: Secondary | ICD-10-CM

## 2023-03-27 DIAGNOSIS — Z125 Encounter for screening for malignant neoplasm of prostate: Secondary | ICD-10-CM

## 2023-03-27 DIAGNOSIS — E119 Type 2 diabetes mellitus without complications: Secondary | ICD-10-CM

## 2023-03-27 DIAGNOSIS — F17200 Nicotine dependence, unspecified, uncomplicated: Secondary | ICD-10-CM

## 2023-03-27 DIAGNOSIS — J449 Chronic obstructive pulmonary disease, unspecified: Secondary | ICD-10-CM

## 2023-03-27 MED ORDER — AMLODIPINE BESYLATE 5 MG PO TABS: 5.0000 mg | ORAL_TABLET | Freq: Every day | ORAL | 0 refills | Status: DC

## 2023-03-27 MED ORDER — ALBUTEROL SULFATE HFA 108 (90 BASE) MCG/ACT IN AERS: 2.0000 | INHALATION_SPRAY | Freq: Four times a day (QID) | RESPIRATORY_TRACT | 0 refills | Status: DC | PRN

## 2023-03-27 MED ORDER — FLUTICASONE-SALMETEROL 115-21 MCG/ACT IN AERO: 2.0000 | INHALATION_SPRAY | Freq: Two times a day (BID) | RESPIRATORY_TRACT | 0 refills | Status: AC

## 2023-03-27 MED ORDER — ALBUTEROL SULFATE (2.5 MG/3ML) 0.083% IN NEBU: 2.5000 mg | INHALATION_SOLUTION | Freq: Four times a day (QID) | RESPIRATORY_TRACT | 0 refills | Status: AC | PRN

## 2023-03-27 MED ORDER — D-3-5 125 MCG (5000 UT) PO CAPS: 5000.0000 [IU] | ORAL_CAPSULE | Freq: Every day | ORAL | 0 refills | Status: DC

## 2023-03-27 MED ORDER — ATORVASTATIN CALCIUM 40 MG PO TABS: ORAL_TABLET | ORAL | 0 refills | Status: DC

## 2023-03-27 MED ORDER — METFORMIN HCL 1000 MG PO TABS: ORAL_TABLET | ORAL | 0 refills | Status: DC

## 2023-03-27 MED ORDER — LOSARTAN POTASSIUM 100 MG PO TABS: 100.0000 mg | ORAL_TABLET | Freq: Every day | ORAL | 0 refills | Status: DC

## 2023-03-27 NOTE — Patient Instructions (Signed)
[facility]-  enroll Labs- bloodwork

## 2023-03-27 NOTE — Progress Notes (Signed)
BP 130/80   Pulse 84   Temp 97.9 F (36.6 C)   Wt 196 lb 8 oz (89.1 kg)   SpO2 100%   BMI 31.72 kg/m    Subjective:    Patient ID: Thomas Johns, male    DOB: 1960-10-06, 62 y.o.   MRN: [id number]  HPI: Thomas Johns is a 62 y.o. male presenting on 03/27/2023 for Follow-up (Pt states he is out of all of his medications including his inhalers except for his diabetes medication which he is running low on.)   HPI   Chief Complaint  Patient presents with   Follow-up    Pt states he is out of all of his medications including his inhalers except for his diabetes medication which he is running low on.     Pt is in today for routine follow up.  He has not gotten labs drawn still that were ordered for August appointment.  He has not updated enrollment with [facility] or with [facility].  This means that [facility] will not send him his meds for free.  Pt says he is feeling fine and has no new issues but would like his inhalers.  He is still smoking 2ppd.     Relevant past medical, surgical, family and social history reviewed and updated as indicated. Interim medical history since our last visit reviewed. Allergies and medications reviewed and updated.   Current Outpatient Medications:    metFORMIN (GLUCOPHAGE) 1000 MG tablet, TAKE 1 Tablet  BY MOUTH TWICE DAILY WITH A MEAL, Disp: 180 tablet, Rfl: 0   Multiple Vitamins-Minerals (CENTRUM SILVER 50+MEN) TABS, Take by mouth., Disp: , Rfl:    albuterol (PROVENTIL) (2.5 MG/3ML) 0.083% nebulizer solution, Take 3 mLs (2.5 mg total) by nebulization every 6 (six) hours as needed for wheezing or shortness of breath. (Patient not taking: Reported on 03/27/2023), Disp: 75 mL, Rfl: 0   albuterol (VENTOLIN HFA) 108 (90 Base) MCG/ACT inhaler, INHALE 2 PUFFS BY MOUTH EVERY 6 HOURS AS NEEDED FOR COUGHING, WHEEZING, OR SHORTNESS OF BREATH (Patient not taking: Reported on 12/17/2022), Disp: 20.1 g, Rfl: 0   amLODipine (NORVASC) 5 MG tablet, TAKE 1  Tablet BY MOUTH ONCE EVERY DAY (Patient not taking: Reported on 03/27/2023), Disp: 90 tablet, Rfl: 0   atorvastatin (LIPITOR) 40 MG tablet, TAKE 1 Tablet BY MOUTH ONCE DAILY (Patient not taking: Reported on 03/27/2023), Disp: 90 tablet, Rfl: 0   Cholecalciferol (D-3-5) 125 MCG (5000 UT) capsule, Take 5,000 Units by mouth daily. (Patient not taking: Reported on 03/27/2023), Disp: , Rfl:    fluticasone-salmeterol (ADVAIR HFA) 115-21 MCG/ACT inhaler, Inhale 2 puffs into the lungs 2 (two) times daily. (Patient not taking: Reported on 03/27/2023), Disp: , Rfl:    furosemide (LASIX) 20 MG tablet, TAKE 1 Tablet BY MOUTH ONCE EVERY DAY AS NEEDED FOR EDEMA (Patient not taking: Reported on 03/27/2023), Disp: 90 tablet, Rfl: 0   losartan (COZAAR) 100 MG tablet, TAKE 1 Tablet BY MOUTH ONCE EVERY DAY (Patient not taking: Reported on 03/27/2023), Disp: 90 tablet, Rfl: 0   Omega-3 Fatty Acids (FISH OIL) 1200 MG CAPS, Take by mouth. (Patient not taking: Reported on 04/08/2022), Disp: , Rfl:    potassium chloride (KLOR-CON) 10 MEQ tablet, Take 1/2 tablet po qd when taking furosemide (Patient not taking: Reported on 07/15/2022), Disp: 90 tablet, Rfl: 0    Review of Systems  Per HPI unless specifically indicated above     Objective:    BP 130/80   Pulse  84   Temp 97.9 F (36.6 C)   Wt 196 lb 8 oz (89.1 kg)   SpO2 100%   BMI 31.72 kg/m   Wt Readings from Last 3 Encounters:  03/27/23 196 lb 8 oz (89.1 kg)  12/17/22 192 lb 8 oz (87.3 kg)  08/14/22 197 lb 8 oz (89.6 kg)    Physical Exam Vitals reviewed.  Constitutional:      General: He is not in acute distress.    Appearance: He is well-developed. He is not ill-appearing.  HENT:     Head: Normocephalic and atraumatic.  Cardiovascular:     Rate and Rhythm: Normal rate and regular rhythm.  Pulmonary:     Effort: Pulmonary effort is normal.     Breath sounds: Normal breath sounds. No wheezing.  Abdominal:     General: Bowel sounds are normal.      Palpations: Abdomen is soft.     Tenderness: There is no abdominal tenderness.  Musculoskeletal:     Cervical back: Neck supple.  Lymphadenopathy:     Cervical: No cervical adenopathy.  Skin:    General: Skin is warm and dry.  Neurological:     Mental Status: He is alert and oriented to person, place, and time.  Psychiatric:        Behavior: Behavior normal.           Assessment & Plan:     Encounter Diagnoses  Name Primary?   Controlled type 2 diabetes mellitus without complication, without long-term current use of insulin (HCC) Yes   Primary hypertension    Hyperlipidemia associated with type 2 diabetes mellitus (HCC)    Chronic obstructive pulmonary disease, unspecified COPD type (HCC)    Tobacco use disorder    Screening for prostate cancer    Lung cancer screening declined by patient      -Pt was counseled to update enrollment with [facility] and [facility] -He is given rx so he can gets his meds locally -He is encouraged to stop smoking/cut back smoking -Discussed CT to screen for lung cancer but he doesn't want to do that -Pt is asked to update his labs  -Pt will be scheduled to RTO 1 month.  He is to contact office sooner prn

## 2023-04-28 ENCOUNTER — Other Ambulatory Visit (HOSPITAL_COMMUNITY)
Admission: RE | Admit: 2023-04-28 | Discharge: 2023-04-28 | Disposition: A | Discharge status: Home / Self Care | Payer: Self-pay | Source: Ambulatory Visit | Attending: Physician Assistant | Admitting: Physician Assistant

## 2023-04-28 DIAGNOSIS — Z125 Encounter for screening for malignant neoplasm of prostate: Secondary | ICD-10-CM | POA: Insufficient documentation

## 2023-04-28 DIAGNOSIS — E785 Hyperlipidemia, unspecified: Secondary | ICD-10-CM | POA: Insufficient documentation

## 2023-04-28 DIAGNOSIS — E119 Type 2 diabetes mellitus without complications: Secondary | ICD-10-CM | POA: Insufficient documentation

## 2023-04-28 DIAGNOSIS — I10 Essential (primary) hypertension: Secondary | ICD-10-CM | POA: Insufficient documentation

## 2023-04-28 DIAGNOSIS — E1169 Type 2 diabetes mellitus with other specified complication: Secondary | ICD-10-CM | POA: Insufficient documentation

## 2023-04-28 LAB — COMPREHENSIVE METABOLIC PANEL
ALT: 15 U/L (ref 0–44)
AST: 14 U/L — ABNORMAL LOW (ref 15–41)
Albumin: 4.1 g/dL (ref 3.5–5.0)
Alkaline Phosphatase: 91 U/L (ref 38–126)
Anion gap: 8 (ref 5–15)
BUN: 15 mg/dL (ref 8–23)
CO2: 23 mmol/L (ref 22–32)
Calcium: 9.1 mg/dL (ref 8.9–10.3)
Chloride: 105 mmol/L (ref 98–111)
Creatinine, Ser: 0.89 mg/dL (ref 0.61–1.24)
GFR, Estimated: >60 mL/min (ref >60)
Glucose, Bld: 117 mg/dL — ABNORMAL HIGH (ref 70–99)
Potassium: 4.1 mmol/L (ref 3.5–5.1)
Sodium: 136 mmol/L (ref 135–145)
Total Bilirubin: 0.3 mg/dL (ref 0.0–1.2)
Total Protein: 6.7 g/dL (ref 6.5–8.1)

## 2023-04-28 LAB — LIPID PANEL
Cholesterol: 187 mg/dL (ref 0–200)
HDL: 27 mg/dL — ABNORMAL LOW (ref >40)
LDL Cholesterol: 119 mg/dL — ABNORMAL HIGH (ref 0–99)
Total CHOL/HDL Ratio: 6.9 {ratio}
Triglycerides: 205 mg/dL — ABNORMAL HIGH (ref <150)
VLDL: 41 mg/dL — ABNORMAL HIGH (ref 0–40)

## 2023-04-28 LAB — PSA: Prostatic Specific Antigen: 0.33 ng/mL (ref 0.00–4.00)

## 2023-04-29 ENCOUNTER — Encounter: Payer: Self-pay | Admitting: Physician Assistant

## 2023-04-29 ENCOUNTER — Ambulatory Visit: Payer: Self-pay | Admitting: Physician Assistant

## 2023-04-29 VITALS — BP 149/78 | HR 84 | Temp 97.5°F | Wt 204.5 lb

## 2023-04-29 DIAGNOSIS — E1169 Type 2 diabetes mellitus with other specified complication: Secondary | ICD-10-CM

## 2023-04-29 DIAGNOSIS — E119 Type 2 diabetes mellitus without complications: Secondary | ICD-10-CM

## 2023-04-29 DIAGNOSIS — Z5320 Procedure and treatment not carried out because of patient's decision for unspecified reasons: Secondary | ICD-10-CM

## 2023-04-29 DIAGNOSIS — J449 Chronic obstructive pulmonary disease, unspecified: Secondary | ICD-10-CM

## 2023-04-29 DIAGNOSIS — I10 Essential (primary) hypertension: Secondary | ICD-10-CM

## 2023-04-29 DIAGNOSIS — F17200 Nicotine dependence, unspecified, uncomplicated: Secondary | ICD-10-CM

## 2023-04-29 LAB — HEMOGLOBIN A1C
Hgb A1c MFr Bld: 6.1 % — ABNORMAL HIGH (ref 4.8–5.6)
Mean Plasma Glucose: 128 mg/dL

## 2023-04-29 LAB — MICROALBUMIN, URINE: Microalb, Ur: <3 ug/mL — ABNORMAL HIGH

## 2023-04-29 NOTE — Patient Instructions (Addendum)
 Contact [facility] about re-enrollment-  [phone]

## 2023-04-29 NOTE — Progress Notes (Signed)
 BP (!) 149/78 (BP Location: Right Arm, Patient Position: Sitting, Cuff Size: Normal)   Pulse 84   Temp (!) 97.5 F (36.4 C)   Wt 204 lb 8 oz (92.8 kg)   SpO2 99%   BMI 33.01 kg/m    Subjective:    Patient ID: Thomas Johns, male    DOB: 11/19/1960, 63 y.o.   MRN: [id number]  HPI: Thomas Johns is a 63 y.o. male presenting on 04/29/2023 for Follow-up (Pt has been out of his meds for about a month)   HPI   Chief Complaint  Patient presents with   Follow-up    Pt has been out of his meds for about a month    Pt enrollment expired 01/08/23.   He has been reminded of this many times.  He did go to [facility] yesterday to work on this but says he still needs a letter of support.  He says he has been having difficulty getting in touch with his niece Odean who has accompanied him in the past.    Pt has no complaints today.  He continues to smoke 2ppd.      Relevant past medical, surgical, family and social history reviewed and updated as indicated. Interim medical history since our last visit reviewed. Allergies and medications reviewed and updated.    Current Outpatient Medications:    albuterol  (PROVENTIL ) (2.5 MG/3ML) 0.083% nebulizer solution, Take 3 mLs (2.5 mg total) by nebulization every 6 (six) hours as needed for wheezing or shortness of breath., Disp: 75 mL, Rfl: 0   albuterol  (VENTOLIN  HFA) 108 (90 Base) MCG/ACT inhaler, Inhale 2 puffs into the lungs every 6 (six) hours as needed for wheezing or shortness of breath. (Patient not taking: Reported on 04/29/2023), Disp: 20.1 g, Rfl: 0   amLODipine  (NORVASC ) 5 MG tablet, Take 1 tablet (5 mg total) by mouth daily. TAKE 1 Tablet BY MOUTH ONCE EVERY DAY (Patient not taking: Reported on 04/29/2023), Disp: 30 tablet, Rfl: 0   atorvastatin  (LIPITOR) 40 MG tablet, TAKE 1 Tablet BY MOUTH ONCE DAILy (Patient not taking: Reported on 04/29/2023), Disp: 30 tablet, Rfl: 0   Cholecalciferol (D-3-5) 125 MCG (5000 UT) capsule, Take 1 capsule  (5,000 Units total) by mouth daily. (Patient not taking: Reported on 04/29/2023), Disp: 30 capsule, Rfl: 0   fluticasone -salmeterol (ADVAIR  HFA) 115-21 MCG/ACT inhaler, Inhale 2 puffs into the lungs 2 (two) times daily. (Patient not taking: Reported on 04/29/2023), Disp: 1 each, Rfl: 0   furosemide  (LASIX ) 20 MG tablet, TAKE 1 Tablet BY MOUTH ONCE EVERY DAY AS NEEDED FOR EDEMA (Patient not taking: Reported on 04/29/2023), Disp: 90 tablet, Rfl: 0   losartan  (COZAAR ) 100 MG tablet, Take 1 tablet (100 mg total) by mouth daily. (Patient not taking: Reported on 04/29/2023), Disp: 30 tablet, Rfl: 0   metFORMIN  (GLUCOPHAGE ) 1000 MG tablet, TAKE 1 Tablet  BY MOUTH TWICE DAILY WITH A MEAL (Patient not taking: Reported on 04/29/2023), Disp: 60 tablet, Rfl: 0   Multiple Vitamins-Minerals (CENTRUM SILVER 50+MEN) TABS, Take by mouth. (Patient not taking: Reported on 04/29/2023), Disp: , Rfl:    Omega-3 Fatty Acids (FISH OIL) 1200 MG CAPS, Take by mouth. (Patient not taking: Reported on 04/29/2023), Disp: , Rfl:    potassium chloride  (KLOR-CON ) 10 MEQ tablet, Take 1/2 tablet po qd when taking furosemide  (Patient not taking: Reported on 04/29/2023), Disp: 90 tablet, Rfl: 0    Review of Systems  Per HPI unless specifically indicated above  Objective:    BP (!) 149/78 (BP Location: Right Arm, Patient Position: Sitting, Cuff Size: Normal)   Pulse 84   Temp (!) 97.5 F (36.4 C)   Wt 204 lb 8 oz (92.8 kg)   SpO2 99%   BMI 33.01 kg/m   Wt Readings from Last 3 Encounters:  04/29/23 204 lb 8 oz (92.8 kg)  03/27/23 196 lb 8 oz (89.1 kg)  12/17/22 192 lb 8 oz (87.3 kg)    Physical Exam Vitals reviewed.  Constitutional:      General: He is not in acute distress.    Appearance: He is well-developed. He is obese. He is not toxic-appearing.  HENT:     Head: Normocephalic and atraumatic.  Cardiovascular:     Rate and Rhythm: Normal rate and regular rhythm.  Pulmonary:     Effort: Pulmonary effort is normal.      Breath sounds: Normal breath sounds. No wheezing.  Abdominal:     General: Bowel sounds are normal.     Palpations: Abdomen is soft.     Tenderness: There is no abdominal tenderness.  Musculoskeletal:     Cervical back: Neck supple.     Right lower leg: No edema.     Left lower leg: No edema.  Lymphadenopathy:     Cervical: No cervical adenopathy.  Skin:    General: Skin is warm and dry.  Neurological:     Mental Status: He is alert and oriented to person, place, and time.  Psychiatric:        Behavior: Behavior normal.     Results for orders placed or performed during the hospital encounter of 04/28/23  Lipid panel   Collection Time: 04/28/23 10:03 AM  Result Value Ref Range   Cholesterol 187 0 - 200 mg/dL   Triglycerides 794 (H) <150 mg/dL   HDL 27 (L) >59 mg/dL   Total CHOL/HDL Ratio 6.9 RATIO   VLDL 41 (H) 0 - 40 mg/dL   LDL Cholesterol 880 (H) 0 - 99 mg/dL  Hemoglobin J8r   Collection Time: 04/28/23 10:03 AM  Result Value Ref Range   Hgb A1c MFr Bld 6.1 (H) 4.8 - 5.6 %   Mean Plasma Glucose 128 mg/dL  PSA   Collection Time: 04/28/23 10:03 AM  Result Value Ref Range   Prostatic Specific Antigen 0.33 0.00 - 4.00 ng/mL  Microalbumin, urine   Collection Time: 04/28/23 10:03 AM  Result Value Ref Range   Microalb, Ur <3.0 (H) Not Estab. ug/mL  Comprehensive metabolic panel   Collection Time: 04/28/23 10:03 AM  Result Value Ref Range   Sodium 136 135 - 145 mmol/L   Potassium 4.1 3.5 - 5.1 mmol/L   Chloride 105 98 - 111 mmol/L   CO2 23 22 - 32 mmol/L   Glucose, Bld 117 (H) 70 - 99 mg/dL   BUN 15 8 - 23 mg/dL   Creatinine, Ser 9.10 0.61 - 1.24 mg/dL   Calcium  9.1 8.9 - 10.3 mg/dL   Total Protein 6.7 6.5 - 8.1 g/dL   Albumin 4.1 3.5 - 5.0 g/dL   AST 14 (L) 15 - 41 U/L   ALT 15 0 - 44 U/L   Alkaline Phosphatase 91 38 - 126 U/L   Total Bilirubin 0.3 0.0 - 1.2 mg/dL   GFR, Estimated >39 >39 mL/min   Anion gap 8 5 - 15      Assessment & Plan:    Encounter  Diagnoses  Name Primary?   Chronic  obstructive pulmonary disease, unspecified COPD type (HCC) Yes   Tobacco use disorder    Primary hypertension    Controlled type 2 diabetes mellitus without complication, without long-term current use of insulin (HCC)    Hyperlipidemia associated with type 2 diabetes mellitus (HCC)    Lung cancer screening declined by patient      -reviewed labs with pt -pt Declined CT screening for lung cancer -discussed with pt need to get his re-enrollment completed -offered to give pt rx to get meds at local pharmacy but he declined.  He is to let us  know when he gets his enrollment/[facility] completed so rx can be sent -pt is given samples albuterol  mdi and advair   -pt will follow up in 3 months.  He is to contact office sooner prn

## 2023-05-08 ENCOUNTER — Other Ambulatory Visit: Payer: Self-pay | Admitting: Physician Assistant

## 2023-05-08 MED ORDER — LOSARTAN POTASSIUM 100 MG PO TABS: 100.0000 mg | ORAL_TABLET | Freq: Every day | ORAL | 0 refills | Status: DC

## 2023-05-08 MED ORDER — ATORVASTATIN CALCIUM 40 MG PO TABS: ORAL_TABLET | ORAL | 0 refills | Status: DC

## 2023-05-08 MED ORDER — FLUTICASONE FUROATE-VILANTEROL 100-25 MCG/ACT IN AEPB: 1.0000 | INHALATION_SPRAY | Freq: Every day | RESPIRATORY_TRACT | 0 refills | Status: DC

## 2023-05-08 MED ORDER — METFORMIN HCL 1000 MG PO TABS: ORAL_TABLET | ORAL | 0 refills | Status: DC

## 2023-05-08 MED ORDER — POTASSIUM CHLORIDE CRYS ER 10 MEQ PO TBCR: EXTENDED_RELEASE_TABLET | ORAL | 0 refills | Status: AC

## 2023-05-08 MED ORDER — D-3-5 125 MCG (5000 UT) PO CAPS: 5000.0000 [IU] | ORAL_CAPSULE | Freq: Every day | ORAL | 0 refills | Status: DC

## 2023-05-08 MED ORDER — ALBUTEROL SULFATE HFA 108 (90 BASE) MCG/ACT IN AERS: 2.0000 | INHALATION_SPRAY | Freq: Four times a day (QID) | RESPIRATORY_TRACT | 0 refills | Status: DC | PRN

## 2023-05-08 MED ORDER — AMLODIPINE BESYLATE 5 MG PO TABS: 5.0000 mg | ORAL_TABLET | Freq: Every day | ORAL | 0 refills | Status: DC

## 2023-05-08 MED ORDER — FUROSEMIDE 20 MG PO TABS: ORAL_TABLET | ORAL | 0 refills | Status: DC

## 2023-07-16 ENCOUNTER — Other Ambulatory Visit: Payer: Self-pay | Admitting: Physician Assistant

## 2023-07-16 DIAGNOSIS — E119 Type 2 diabetes mellitus without complications: Secondary | ICD-10-CM

## 2023-07-16 DIAGNOSIS — E1169 Type 2 diabetes mellitus with other specified complication: Secondary | ICD-10-CM

## 2023-07-16 DIAGNOSIS — I10 Essential (primary) hypertension: Secondary | ICD-10-CM

## 2023-07-29 ENCOUNTER — Ambulatory Visit: Payer: Self-pay | Admitting: Physician Assistant

## 2023-07-29 ENCOUNTER — Encounter: Payer: Self-pay | Admitting: Physician Assistant

## 2023-07-29 VITALS — BP 122/64 | HR 99 | Temp 98.7°F | Wt 204.5 lb

## 2023-07-29 DIAGNOSIS — Z1211 Encounter for screening for malignant neoplasm of colon: Secondary | ICD-10-CM

## 2023-07-29 DIAGNOSIS — F17200 Nicotine dependence, unspecified, uncomplicated: Secondary | ICD-10-CM

## 2023-07-29 DIAGNOSIS — Z5320 Procedure and treatment not carried out because of patient's decision for unspecified reasons: Secondary | ICD-10-CM

## 2023-07-29 DIAGNOSIS — E1169 Type 2 diabetes mellitus with other specified complication: Secondary | ICD-10-CM

## 2023-07-29 DIAGNOSIS — E119 Type 2 diabetes mellitus without complications: Secondary | ICD-10-CM

## 2023-07-29 DIAGNOSIS — J449 Chronic obstructive pulmonary disease, unspecified: Secondary | ICD-10-CM

## 2023-07-29 DIAGNOSIS — I10 Essential (primary) hypertension: Secondary | ICD-10-CM

## 2023-07-29 NOTE — Progress Notes (Signed)
 BP 122/64   Pulse 99   Temp 98.7 F (37.1 C)   Wt 204 lb 8 oz (92.8 kg)   SpO2 99%   BMI 33.01 kg/m    Subjective:    Patient ID: Faye Ramsay, male    DOB: 1960-07-02, 63 y.o.   MRN: [id number]  HPI: HORRIS SPEROS is a 63 y.o. male presenting on 07/29/2023 for Diabetes, Hypertension, Hyperlipidemia, and COPD   HPI   Chief Complaint  Patient presents with   Diabetes   Hypertension   Hyperlipidemia   COPD    Pt says he has Been feeling a lack of energy lately lately.  He Didn't get labs drawn He Didn't ever return FIT test for colon cancer screening  He is Still smoking 2 ppd He Says his breathing is okay He denies CP He denies depression  He doesn't exercise He watches TV and youtube on his phone, plays with his cat.      Relevant past medical, surgical, family and social history reviewed and updated as indicated. Interim medical history since our last visit reviewed. Allergies and medications reviewed and updated.    Current Outpatient Medications:    albuterol (PROVENTIL) (2.5 MG/3ML) 0.083% nebulizer solution, Take 3 mLs (2.5 mg total) by nebulization every 6 (six) hours as needed for wheezing or shortness of breath., Disp: 75 mL, Rfl: 0   albuterol (VENTOLIN HFA) 108 (90 Base) MCG/ACT inhaler, Inhale 2 puffs into the lungs every 6 (six) hours as needed for wheezing or shortness of breath., Disp: 3 each, Rfl: 0   amLODipine (NORVASC) 5 MG tablet, Take 1 tablet (5 mg total) by mouth daily. TAKE 1 Tablet BY MOUTH ONCE EVERY DAY, Disp: 90 tablet, Rfl: 0   atorvastatin (LIPITOR) 40 MG tablet, TAKE 1 Tablet BY MOUTH ONCE DAILy, Disp: 90 tablet, Rfl: 0   fluticasone furoate-vilanterol (BREO ELLIPTA) 100-25 MCG/ACT AEPB, Inhale 1 puff into the lungs daily., Disp: 3 each, Rfl: 0   fluticasone-salmeterol (ADVAIR HFA) 115-21 MCG/ACT inhaler, Inhale 2 puffs into the lungs 2 (two) times daily., Disp: 1 each, Rfl: 0   losartan (COZAAR) 100 MG tablet, Take 1 tablet  (100 mg total) by mouth daily., Disp: 90 tablet, Rfl: 0   metFORMIN (GLUCOPHAGE) 1000 MG tablet, TAKE 1 Tablet  BY MOUTH TWICE DAILY WITH A MEAL, Disp: 180 tablet, Rfl: 0   Cholecalciferol (D-3-5) 125 MCG (5000 UT) capsule, Take 1 capsule (5,000 Units total) by mouth daily., Disp: 90 capsule, Rfl: 0   furosemide (LASIX) 20 MG tablet, TAKE 1 Tablet BY MOUTH ONCE EVERY DAY AS NEEDED FOR EDEMA, Disp: 90 tablet, Rfl: 0   Multiple Vitamins-Minerals (CENTRUM SILVER 50+MEN) TABS, Take by mouth. (Patient not taking: Reported on 04/29/2023), Disp: , Rfl:    Omega-3 Fatty Acids (FISH OIL) 1200 MG CAPS, Take by mouth. (Patient not taking: Reported on 07/29/2023), Disp: , Rfl:    potassium chloride (KLOR-CON M) 10 MEQ tablet, Take 1/2 tablet po qd when taking furosemide, Disp: 90 tablet, Rfl: 0   Review of Systems  Per HPI unless specifically indicated above     Objective:    BP 122/64   Pulse 99   Temp 98.7 F (37.1 C)   Wt 204 lb 8 oz (92.8 kg)   SpO2 99%   BMI 33.01 kg/m   Wt Readings from Last 3 Encounters:  07/29/23 204 lb 8 oz (92.8 kg)  04/29/23 204 lb 8 oz (92.8 kg)  03/27/23 196 lb 8  oz (89.1 kg)    Physical Exam Vitals reviewed.  Constitutional:      General: He is not in acute distress.    Appearance: He is not toxic-appearing.     Comments: Appears older than stated age  HENT:     Head: Normocephalic and atraumatic.  Cardiovascular:     Rate and Rhythm: Normal rate and regular rhythm.  Pulmonary:     Effort: Pulmonary effort is normal.     Breath sounds: Normal breath sounds. No wheezing.  Abdominal:     General: Bowel sounds are normal.     Palpations: Abdomen is soft.     Tenderness: There is no abdominal tenderness.  Musculoskeletal:     Cervical back: Neck supple.     Right lower leg: No edema.     Left lower leg: No edema.  Lymphadenopathy:     Cervical: No cervical adenopathy.  Skin:    General: Skin is warm and dry.  Neurological:     Mental Status: He is  alert and oriented to person, place, and time.  Psychiatric:        Behavior: Behavior normal.           Assessment & Plan:   Encounter Diagnoses  Name Primary?   Controlled type 2 diabetes mellitus without complication, without long-term current use of insulin (HCC) Yes   Primary hypertension    Hyperlipidemia associated with type 2 diabetes mellitus (HCC)    Chronic obstructive pulmonary disease, unspecified COPD type (HCC)    Tobacco use disorder    Lung cancer screening declined by patient    Screening for colon cancer       -Pt declines CT chest to screen lung cancer -Pt to get labs drawn.  He will be called with results -Pt to return FIT test for colon cancer screening; he was given another in case he couldn't locate the one he was given previously -no changes to medication today -DM foot exam updated -pt encouraged to walk at least 15 minutes daily to help energy levels -pt to follow up 3 months. He is to contact office sooner prn

## 2023-07-29 NOTE — Patient Instructions (Signed)
 Walk 15 minutes/day  Get blood drawn Bring poop test back

## 2023-08-12 ENCOUNTER — Other Ambulatory Visit: Payer: Self-pay | Admitting: Physician Assistant

## 2023-08-14 ENCOUNTER — Other Ambulatory Visit: Payer: Self-pay | Admitting: Physician Assistant

## 2023-08-14 DIAGNOSIS — Z1211 Encounter for screening for malignant neoplasm of colon: Secondary | ICD-10-CM

## 2023-08-14 LAB — POC FIT TEST STOOL: Fecal Occult Blood: POSITIVE

## 2023-08-19 ENCOUNTER — Other Ambulatory Visit: Payer: Self-pay | Admitting: Physician Assistant

## 2023-08-19 DIAGNOSIS — R195 Other fecal abnormalities: Secondary | ICD-10-CM

## 2023-08-26 ENCOUNTER — Encounter (INDEPENDENT_AMBULATORY_CARE_PROVIDER_SITE_OTHER): Payer: Self-pay | Admitting: *Deleted

## 2023-09-02 ENCOUNTER — Encounter (INDEPENDENT_AMBULATORY_CARE_PROVIDER_SITE_OTHER): Payer: Self-pay | Admitting: *Deleted

## 2023-09-23 ENCOUNTER — Ambulatory Visit (INDEPENDENT_AMBULATORY_CARE_PROVIDER_SITE_OTHER): Payer: Self-pay | Admitting: Gastroenterology

## 2023-09-23 ENCOUNTER — Encounter (INDEPENDENT_AMBULATORY_CARE_PROVIDER_SITE_OTHER): Payer: Self-pay | Admitting: Gastroenterology

## 2023-09-23 ENCOUNTER — Telehealth: Payer: Self-pay | Admitting: *Deleted

## 2023-09-23 VITALS — BP 145/84 | HR 82 | Temp 97.7°F | Ht 66.0 in | Wt 211.2 lb

## 2023-09-23 DIAGNOSIS — R195 Other fecal abnormalities: Secondary | ICD-10-CM | POA: Insufficient documentation

## 2023-09-23 DIAGNOSIS — I10 Essential (primary) hypertension: Secondary | ICD-10-CM | POA: Insufficient documentation

## 2023-09-23 DIAGNOSIS — Z6834 Body mass index (BMI) 34.0-34.9, adult: Secondary | ICD-10-CM | POA: Insufficient documentation

## 2023-09-23 NOTE — Progress Notes (Signed)
 Laurita Peron Faizan Dredyn Gubbels , M.D. Gastroenterology & Hepatology [facility] [address] [place], [place] [address] Primary Care Physician: Luane Rumps, Kirby Peoples [address] [place] [address]  Chief Complaint: Positive fit test  History of Present Illness: Thomas Johns is a 63 y.o. male with hypertension who presents for evaluation of positive fit test  Patient reports no active GI complaints. The patient denies having any nausea, vomiting, fever, chills, hematochezia, melena, hematemesis, abdominal distention, abdominal pain, diarrhea, jaundice, pruritus or weight loss.  Labs from 04/2023 AST 14 ALT 15 alk phos 91 T. bili 0.3 hemoglobin A1c 6.1  Fit test + 07/2023  Last UJW:JXBJ Last Colonoscopy:over 10 years ago at OSH  FHx: neg for any gastrointestinal/liver disease, no malignancies Social: Active cigarette smoking Surgical: no abdominal surgeries  Past Medical History: Past Medical History:  Diagnosis Date   Hypertension     Past Surgical History: Past Surgical History:  Procedure Laterality Date   EYE SURGERY      Family History: Family History  Problem Relation Age of Onset   Lupus Mother    Rheum arthritis Mother    Diabetes Mother     Social History: Social History   Tobacco Use  Smoking Status Every Day   Current packs/day: 2.00   Types: Cigarettes  Smokeless Tobacco Never   Social History   Substance and Sexual Activity  Alcohol Use Not Currently   Social History   Substance and Sexual Activity  Drug Use No    Allergies: No Known Allergies  Medications: Current Outpatient Medications  Medication Sig Dispense Refill   albuterol  (PROVENTIL ) (2.5 MG/3ML) 0.083% nebulizer solution Take 3 mLs (2.5 mg total) by nebulization every 6 (six) hours as needed for wheezing or shortness of breath. 75 mL 0   albuterol  (VENTOLIN  HFA) 108 (90 Base) MCG/ACT inhaler INHALE 2 PUFFS BY MOUTH EVERY 6  HOURS AS NEEDED FOR COUGHING, WHEEZING, OR SHORTNESS OF BREATH 20.1 g 0   amLODipine  (NORVASC ) 5 MG tablet TAKE 1 Tablet BY MOUTH ONCE DAILY 90 tablet 0   atorvastatin  (LIPITOR) 40 MG tablet TAKE 1 Tablet BY MOUTH ONCE DAILY 90 tablet 0   BREO ELLIPTA  100-25 MCG/ACT AEPB INHALE 1 PUFF BY MOUTH EVERY DAY. RINSE MOUTH AFTER EVERY USE. 180 each 0   fluticasone -salmeterol (ADVAIR  HFA) 115-21 MCG/ACT inhaler Inhale 2 puffs into the lungs 2 (two) times daily. 1 each 0   furosemide  (LASIX ) 20 MG tablet TAKE 1 Tablet BY MOUTH ONCE DAILY AS NEEDED FOR EDEMA 90 tablet 0   losartan  (COZAAR ) 100 MG tablet TAKE 1 Tablet BY MOUTH ONCE DAILY 90 tablet 0   metFORMIN  (GLUCOPHAGE ) 1000 MG tablet TAKE 1 Tablet  BY MOUTH TWICE DAILY WITH A MEAL 180 tablet 0   Multiple Vitamins-Minerals (CENTRUM SILVER 50+MEN) TABS Take by mouth.     potassium chloride  (KLOR-CON  M) 10 MEQ tablet Take 1/2 tablet po qd when taking furosemide  (Patient not taking: Reported on 09/23/2023) 90 tablet 0   No current facility-administered medications for this visit.    Review of Systems: GENERAL: negative for malaise, night sweats HEENT: No changes in hearing or vision, no nose bleeds or other nasal problems. NECK: Negative for lumps, goiter, pain and significant neck swelling RESPIRATORY: Negative for cough, wheezing CARDIOVASCULAR: Negative for chest pain, leg swelling, palpitations, orthopnea GI: SEE HPI MUSCULOSKELETAL: Negative for joint pain or swelling, back pain, and muscle pain. SKIN: Negative for lesions, rash HEMATOLOGY Negative for prolonged bleeding,  bruising easily, and swollen nodes. ENDOCRINE: Negative for cold or heat intolerance, polyuria, polydipsia and goiter. NEURO: negative for tremor, gait imbalance, syncope and seizures. The remainder of the review of systems is noncontributory.   Physical Exam: BP (!) 145/84 (BP Location: Left Arm, Patient Position: Sitting, Cuff Size: Large)   Pulse 82   Temp 97.7 F (36.5  C) (Temporal)   Ht 5\' 6"  (1.676 m)   Wt 211 lb 3.2 oz (95.8 kg)   BMI 34.09 kg/m  GENERAL: The patient is AO x3, in no acute distress. HEENT: Head is normocephalic and atraumatic. EOMI are intact. Mouth is well hydrated and without lesions. NECK: Supple. No masses LUNGS: Clear to auscultation. No presence of rhonchi/wheezing/rales. Adequate chest expansion HEART: RRR, normal s1 and s2. ABDOMEN: Soft, nontender, no guarding, no peritoneal signs, and nondistended. BS +. No masses.  Imaging/Labs: as above     Latest Ref Rng & Units 01/02/2015    7:20 PM 07/29/2008   12:55 PM  CBC  WBC 4.0 - 10.5 K/uL 7.3  8.3   Hemoglobin 13.0 - 17.0 g/dL 28.4  13.2   Hematocrit 39.0 - 52.0 % 47.3  47.2   Platelets 150 - 400 K/uL 283  268    No results found for: "IRON", "TIBC", "FERRITIN"  I personally reviewed and interpreted the available labs, imaging and endoscopic files.  Impression and Plan: Thomas Johns is a 63 y.o. male with hypertension who presents for evaluation of positive fit test  #Positive Fit test   Patient does not have any high risk factors for colorectal cancer malignancy.  He has been asymptomatic. Discussed  FIT test  results in detail, specifically what it means when the test is positive or negative.  Discussed that there is a possibility that even when the test is positive there may not be a polyp found on colonoscopy. More than 50% of the office visit was dedicated to discussing the procedure, including the day of and risks involved. Patient understands what the procedure involves including the benefits and any risks. Patient understands alternatives to the proposed procedure. Risks including (but not limited to) bleeding, tearing of the lining (perforation), rupture of adjacent organs, problems with heart and lung function, infection, and medication reactions. A small percentage of complications may require surgery, hospitalization, repeat endoscopic procedure, and/or  transfusion. A small percentage of polyps and other tumors may not be seen.  - Schedule colonoscopy, patient is to be uninsured and would require medical financial assistance for this diagnostic test  #HTN The patient was found to have elevated blood pressure when vital signs were checked in the office. The blood pressure was rechecked by the nursing staff and it was found be persistently elevated >140/90 mmHg. I personally advised to the patient to follow up closely with PCP for hypertension control.   #BMI 34       - walking at a brisk pace/biking at moderate intesity 2.5-5 hours per week     - use pedometer/step counter to track activity     - goal to lose 5-10% of initial body weight     - avoid suagry drinks and juices, use zero calorie beverages     - increase water intake     - eat a low carb diet with plenty of veggies and fruit     - Get sufficient sleep 7-8 hrs nightly     - maitain active lifestyle     - avoid alcohol     -  recommend 2-3 cups Coffee daily     - Counsel on lowering cholesterol by having a diet rich in vegetables,          protein (avoid red meats) and good fats(fish, salmon).    All questions were answered.      Izic Stfort Faizan Cleatus Goodin, MD Gastroenterology and Hepatology [facility] Gastroenterology   This chart has been completed using [facility] Dictation software, and while attempts have been made to ensure accuracy , certain words and phrases may not be transcribed as intended

## 2023-09-23 NOTE — Telephone Encounter (Signed)
 Attempted to call pt, unable to leave message due to mailbox not being set up  TCS w/Dr. Alita Irwin, any room

## 2023-09-23 NOTE — Patient Instructions (Signed)
 It was very nice to meet you today, as dicussed with will plan for the following :  1) colonoscopy

## 2023-09-29 ENCOUNTER — Encounter: Payer: Self-pay | Admitting: *Deleted

## 2023-09-29 NOTE — Telephone Encounter (Signed)
 Attempted to call pt, unable to leave vm due to voicemail not being set up. Letter mailed

## 2023-10-29 ENCOUNTER — Ambulatory Visit: Payer: Self-pay | Admitting: Physician Assistant

## 2023-10-29 ENCOUNTER — Other Ambulatory Visit (HOSPITAL_COMMUNITY)
Admission: RE | Admit: 2023-10-29 | Discharge: 2023-10-29 | Disposition: A | Discharge status: Home / Self Care | Payer: Self-pay | Source: Ambulatory Visit | Attending: Physician Assistant | Admitting: Physician Assistant

## 2023-10-29 ENCOUNTER — Encounter: Payer: Self-pay | Admitting: Physician Assistant

## 2023-10-29 VITALS — BP 116/70 | HR 85 | Temp 98.0°F | Ht 66.0 in | Wt 210.0 lb

## 2023-10-29 DIAGNOSIS — E1169 Type 2 diabetes mellitus with other specified complication: Secondary | ICD-10-CM | POA: Diagnosis present

## 2023-10-29 DIAGNOSIS — F17200 Nicotine dependence, unspecified, uncomplicated: Secondary | ICD-10-CM

## 2023-10-29 DIAGNOSIS — E785 Hyperlipidemia, unspecified: Secondary | ICD-10-CM | POA: Diagnosis present

## 2023-10-29 DIAGNOSIS — I10 Essential (primary) hypertension: Secondary | ICD-10-CM | POA: Diagnosis present

## 2023-10-29 DIAGNOSIS — E119 Type 2 diabetes mellitus without complications: Secondary | ICD-10-CM

## 2023-10-29 DIAGNOSIS — R195 Other fecal abnormalities: Secondary | ICD-10-CM

## 2023-10-29 DIAGNOSIS — J449 Chronic obstructive pulmonary disease, unspecified: Secondary | ICD-10-CM

## 2023-10-29 LAB — COMPREHENSIVE METABOLIC PANEL WITH GFR
ALT: 13 U/L (ref 0–44)
AST: 13 U/L — ABNORMAL LOW (ref 15–41)
Albumin: 4 g/dL (ref 3.5–5.0)
Alkaline Phosphatase: 101 U/L (ref 38–126)
Anion gap: 9 (ref 5–15)
BUN: 25 mg/dL — ABNORMAL HIGH (ref 8–23)
CO2: 21 mmol/L — ABNORMAL LOW (ref 22–32)
Calcium: 8.7 mg/dL — ABNORMAL LOW (ref 8.9–10.3)
Chloride: 107 mmol/L (ref 98–111)
Creatinine, Ser: 1 mg/dL (ref 0.61–1.24)
GFR, Estimated: >60 mL/min (ref >60)
Glucose, Bld: 114 mg/dL — ABNORMAL HIGH (ref 70–99)
Potassium: 4.3 mmol/L (ref 3.5–5.1)
Sodium: 137 mmol/L (ref 135–145)
Total Bilirubin: 0.5 mg/dL (ref 0.0–1.2)
Total Protein: 6.9 g/dL (ref 6.5–8.1)

## 2023-10-29 LAB — LIPID PANEL
Cholesterol: 100 mg/dL (ref 0–200)
HDL: 23 mg/dL — ABNORMAL LOW (ref >40)
LDL Cholesterol: 59 mg/dL (ref 0–99)
Total CHOL/HDL Ratio: 4.3 ratio
Triglycerides: 92 mg/dL (ref <150)
VLDL: 18 mg/dL (ref 0–40)

## 2023-10-29 NOTE — Progress Notes (Signed)
 BP 116/70   Pulse 85   Temp 98 F (36.7 C)   Ht 5' 6 (1.676 m)   Wt 210 lb (95.3 kg)   SpO2 96%   BMI 33.89 kg/m    Subjective:    Patient ID: Thomas Johns, male    DOB: 15-Mar-1961, 63 y.o.   MRN: [id number]  HPI: Thomas Johns is a 63 y.o. male presenting on 10/29/2023 for Follow-up   HPI  Pt says he is doing well and he has no complaints.    He was seen by GI for positive FIT test and is awaiting medicaid decision prior to being scheduled for a colonoscopy.    Relevant past medical, surgical, family and social history reviewed and updated as indicated. Interim medical history since our last visit reviewed. Allergies and medications reviewed and updated.   Current Outpatient Medications:    albuterol  (PROVENTIL ) (2.5 MG/3ML) 0.083% nebulizer solution, Take 3 mLs (2.5 mg total) by nebulization every 6 (six) hours as needed for wheezing or shortness of breath., Disp: 75 mL, Rfl: 0   albuterol  (VENTOLIN  HFA) 108 (90 Base) MCG/ACT inhaler, INHALE 2 PUFFS BY MOUTH EVERY 6 HOURS AS NEEDED FOR COUGHING, WHEEZING, OR SHORTNESS OF BREATH, Disp: 20.1 g, Rfl: 0   amLODipine  (NORVASC ) 5 MG tablet, TAKE 1 Tablet BY MOUTH ONCE DAILY, Disp: 90 tablet, Rfl: 0   atorvastatin  (LIPITOR) 40 MG tablet, TAKE 1 Tablet BY MOUTH ONCE DAILY, Disp: 90 tablet, Rfl: 0   BREO ELLIPTA  100-25 MCG/ACT AEPB, INHALE 1 PUFF BY MOUTH EVERY DAY. RINSE MOUTH AFTER EVERY USE., Disp: 180 each, Rfl: 0   fluticasone -salmeterol (ADVAIR  HFA) 115-21 MCG/ACT inhaler, Inhale 2 puffs into the lungs 2 (two) times daily., Disp: 1 each, Rfl: 0   losartan  (COZAAR ) 100 MG tablet, TAKE 1 Tablet BY MOUTH ONCE DAILY, Disp: 90 tablet, Rfl: 0   metFORMIN  (GLUCOPHAGE ) 1000 MG tablet, TAKE 1 Tablet  BY MOUTH TWICE DAILY WITH A MEAL, Disp: 180 tablet, Rfl: 0   furosemide  (LASIX ) 20 MG tablet, TAKE 1 Tablet BY MOUTH ONCE DAILY AS NEEDED FOR EDEMA (Patient not taking: Reported on 10/29/2023), Disp: 90 tablet, Rfl: 0   Multiple  Vitamins-Minerals (CENTRUM SILVER 50+MEN) TABS, Take by mouth. (Patient not taking: Reported on 10/29/2023), Disp: , Rfl:    potassium chloride  (KLOR-CON  M) 10 MEQ tablet, Take 1/2 tablet po qd when taking furosemide  (Patient not taking: Reported on 10/29/2023), Disp: 90 tablet, Rfl: 0    Review of Systems  Per HPI unless specifically indicated above     Objective:    BP 116/70   Pulse 85   Temp 98 F (36.7 C)   Ht 5' 6 (1.676 m)   Wt 210 lb (95.3 kg)   SpO2 96%   BMI 33.89 kg/m   Wt Readings from Last 3 Encounters:  10/29/23 210 lb (95.3 kg)  09/23/23 211 lb 3.2 oz (95.8 kg)  07/29/23 204 lb 8 oz (92.8 kg)    Physical Exam Vitals reviewed.  Constitutional:      General: He is not in acute distress.    Appearance: He is obese. He is not toxic-appearing.  HENT:     Head: Normocephalic and atraumatic.  Cardiovascular:     Rate and Rhythm: Normal rate and regular rhythm.  Pulmonary:     Effort: Pulmonary effort is normal.     Breath sounds: Normal breath sounds. No wheezing.  Abdominal:     General: Bowel sounds are normal.  Palpations: Abdomen is soft.     Tenderness: There is no abdominal tenderness.  Musculoskeletal:     Cervical back: Neck supple.     Right lower leg: No edema.     Left lower leg: No edema.  Lymphadenopathy:     Cervical: No cervical adenopathy.  Skin:    General: Skin is warm and dry.  Neurological:     Mental Status: He is alert and oriented to person, place, and time.  Psychiatric:        Behavior: Behavior normal.     Results for orders placed or performed during the hospital encounter of 10/29/23  Comprehensive metabolic panel   Collection Time: 10/29/23 11:05 AM  Result Value Ref Range   Sodium 137 135 - 145 mmol/L   Potassium 4.3 3.5 - 5.1 mmol/L   Chloride 107 98 - 111 mmol/L   CO2 21 (L) 22 - 32 mmol/L   Glucose, Bld 114 (H) 70 - 99 mg/dL   BUN 25 (H) 8 - 23 mg/dL   Creatinine, Ser 8.99 0.61 - 1.24 mg/dL   Calcium  8.7 (L)  8.9 - 10.3 mg/dL   Total Protein 6.9 6.5 - 8.1 g/dL   Albumin 4.0 3.5 - 5.0 g/dL   AST 13 (L) 15 - 41 U/L   ALT 13 0 - 44 U/L   Alkaline Phosphatase 101 38 - 126 U/L   Total Bilirubin 0.5 0.0 - 1.2 mg/dL   GFR, Estimated >39 >39 mL/min   Anion gap 9 5 - 15  Lipid panel   Collection Time: 10/29/23 11:06 AM  Result Value Ref Range   Cholesterol 100 0 - 200 mg/dL   Triglycerides 92 <849 mg/dL   HDL 23 (L) >59 mg/dL   Total CHOL/HDL Ratio 4.3 RATIO   VLDL 18 0 - 40 mg/dL   LDL Cholesterol 59 0 - 99 mg/dL      Assessment & Plan:   Encounter Diagnoses  Name Primary?   Controlled type 2 diabetes mellitus without complication, without long-term current use of insulin (HCC) Yes   Primary hypertension    Hyperlipidemia associated with type 2 diabetes mellitus (HCC)    Chronic obstructive pulmonary disease, unspecified COPD type (HCC)    Tobacco use disorder    Positive FIT (fecal immunochemical test)      -reviewed labs with pt.  A1c is pending and he will be contacted with that result when available -will check on pt medicaid application -no changes to medications today -pt to follow up 3 months. He is to contact office sooner prn

## 2023-10-30 LAB — HEMOGLOBIN A1C
Hgb A1c MFr Bld: 6.3 % — ABNORMAL HIGH (ref 4.8–5.6)
Mean Plasma Glucose: 134 mg/dL

## 2023-12-02 DIAGNOSIS — Z6834 Body mass index (BMI) 34.0-34.9, adult: Secondary | ICD-10-CM | POA: Diagnosis not present

## 2023-12-02 DIAGNOSIS — J4520 Mild intermittent asthma, uncomplicated: Secondary | ICD-10-CM | POA: Diagnosis not present

## 2023-12-02 DIAGNOSIS — E119 Type 2 diabetes mellitus without complications: Secondary | ICD-10-CM | POA: Diagnosis not present

## 2023-12-02 DIAGNOSIS — E782 Mixed hyperlipidemia: Secondary | ICD-10-CM | POA: Diagnosis not present

## 2023-12-02 DIAGNOSIS — E669 Obesity, unspecified: Secondary | ICD-10-CM | POA: Diagnosis not present

## 2023-12-02 DIAGNOSIS — Z7984 Long term (current) use of oral hypoglycemic drugs: Secondary | ICD-10-CM | POA: Diagnosis not present

## 2023-12-02 DIAGNOSIS — I10 Essential (primary) hypertension: Secondary | ICD-10-CM | POA: Diagnosis not present

## 2024-01-28 ENCOUNTER — Ambulatory Visit: Payer: Self-pay | Admitting: Physician Assistant

## 2024-02-26 ENCOUNTER — Encounter (INDEPENDENT_AMBULATORY_CARE_PROVIDER_SITE_OTHER): Payer: Self-pay | Admitting: Gastroenterology
